# Patient Record
Sex: Male | Born: 1945
Health system: Southern US, Community
[De-identification: ages and names within clinical notes are randomized; demographics above are authoritative.]

## PROBLEM LIST (undated history)

## (undated) DIAGNOSIS — Z7982 Long term (current) use of aspirin: Secondary | ICD-10-CM

## (undated) DIAGNOSIS — R011 Cardiac murmur, unspecified: Secondary | ICD-10-CM

## (undated) DIAGNOSIS — I5189 Other ill-defined heart diseases: Secondary | ICD-10-CM

## (undated) DIAGNOSIS — Z951 Presence of aortocoronary bypass graft: Secondary | ICD-10-CM

## (undated) DIAGNOSIS — I350 Nonrheumatic aortic (valve) stenosis: Secondary | ICD-10-CM

## (undated) DIAGNOSIS — E785 Hyperlipidemia, unspecified: Secondary | ICD-10-CM

## (undated) DIAGNOSIS — R7303 Prediabetes: Secondary | ICD-10-CM

## (undated) DIAGNOSIS — M1711 Unilateral primary osteoarthritis, right knee: Secondary | ICD-10-CM

## (undated) DIAGNOSIS — Z955 Presence of coronary angioplasty implant and graft: Secondary | ICD-10-CM

## (undated) DIAGNOSIS — Z8619 Personal history of other infectious and parasitic diseases: Secondary | ICD-10-CM

## (undated) DIAGNOSIS — K219 Gastro-esophageal reflux disease without esophagitis: Secondary | ICD-10-CM

## (undated) DIAGNOSIS — M51379 Other intervertebral disc degeneration, lumbosacral region without mention of lumbar back pain or lower extremity pain: Secondary | ICD-10-CM

## (undated) DIAGNOSIS — I2510 Atherosclerotic heart disease of native coronary artery without angina pectoris: Secondary | ICD-10-CM

## (undated) DIAGNOSIS — I10 Essential (primary) hypertension: Secondary | ICD-10-CM

## (undated) HISTORY — DX: Hyperlipidemia, unspecified: E78.5

## (undated) HISTORY — PX: OTHER SURGICAL HISTORY: SHX169

## (undated) HISTORY — DX: Atherosclerotic heart disease of native coronary artery without angina pectoris: I25.10

## (undated) HISTORY — DX: Essential (primary) hypertension: I10

## (undated) HISTORY — PX: TONSILLECTOMY: SUR1361

## (undated) HISTORY — PX: CORONARY ANGIOPLASTY: SHX604

## (undated) HISTORY — DX: Personal history of other infectious and parasitic diseases: Z86.19

---

## 1999-05-27 DIAGNOSIS — I2510 Atherosclerotic heart disease of native coronary artery without angina pectoris: Secondary | ICD-10-CM

## 1999-05-27 HISTORY — PX: PERCUTANEOUS CORONARY STENT INTERVENTION (PCI-S): SHX6016

## 1999-05-27 HISTORY — DX: Atherosclerotic heart disease of native coronary artery without angina pectoris: I25.10

## 2000-02-18 ENCOUNTER — Encounter: Payer: Self-pay | Admitting: Cardiovascular Disease

## 2000-02-19 HISTORY — PX: CORONARY ANGIOPLASTY WITH STENT PLACEMENT: SHX49

## 2000-02-24 ENCOUNTER — Encounter: Payer: Self-pay | Admitting: Cardiovascular Disease

## 2005-05-26 DIAGNOSIS — I2510 Atherosclerotic heart disease of native coronary artery without angina pectoris: Secondary | ICD-10-CM

## 2005-05-26 HISTORY — PX: CORONARY ARTERY BYPASS GRAFT: SHX141

## 2005-05-26 HISTORY — DX: Atherosclerotic heart disease of native coronary artery without angina pectoris: I25.10

## 2005-07-22 ENCOUNTER — Ambulatory Visit: Payer: Self-pay | Admitting: Cardiology

## 2005-07-30 ENCOUNTER — Inpatient Hospital Stay (HOSPITAL_COMMUNITY)
Admission: RE | Admit: 2005-07-30 | Discharge: 2005-08-03 | Payer: Self-pay | Admitting: Thoracic Surgery (Cardiothoracic Vascular Surgery)

## 2005-07-30 DIAGNOSIS — Z951 Presence of aortocoronary bypass graft: Secondary | ICD-10-CM

## 2005-07-30 HISTORY — DX: Presence of aortocoronary bypass graft: Z95.1

## 2005-07-30 HISTORY — PX: CORONARY ARTERY BYPASS GRAFT: SHX141

## 2005-09-01 ENCOUNTER — Encounter
Admission: RE | Admit: 2005-09-01 | Discharge: 2005-09-01 | Payer: Self-pay | Admitting: Thoracic Surgery (Cardiothoracic Vascular Surgery)

## 2009-03-20 ENCOUNTER — Encounter: Payer: Self-pay | Admitting: Cardiovascular Disease

## 2009-03-20 LAB — CONVERTED CEMR LAB
HDL: 27.6 mg/dL
Triglyceride fasting, serum: 199 mg/dL

## 2009-07-03 ENCOUNTER — Ambulatory Visit: Payer: Self-pay | Admitting: Cardiovascular Disease

## 2009-07-03 DIAGNOSIS — E785 Hyperlipidemia, unspecified: Secondary | ICD-10-CM

## 2009-07-03 DIAGNOSIS — I10 Essential (primary) hypertension: Secondary | ICD-10-CM

## 2009-07-03 DIAGNOSIS — I25708 Atherosclerosis of coronary artery bypass graft(s), unspecified, with other forms of angina pectoris: Secondary | ICD-10-CM | POA: Insufficient documentation

## 2009-07-04 ENCOUNTER — Encounter: Payer: Self-pay | Admitting: Cardiovascular Disease

## 2009-07-12 ENCOUNTER — Encounter: Payer: Self-pay | Admitting: Cardiovascular Disease

## 2009-07-17 ENCOUNTER — Encounter: Payer: Self-pay | Admitting: Cardiovascular Disease

## 2009-10-15 ENCOUNTER — Telehealth: Payer: Self-pay | Admitting: Cardiovascular Disease

## 2010-02-11 ENCOUNTER — Telehealth: Payer: Self-pay | Admitting: Cardiovascular Disease

## 2010-06-14 ENCOUNTER — Telehealth: Payer: Self-pay | Admitting: Cardiovascular Disease

## 2010-06-25 NOTE — Cardiovascular Report (Signed)
Summary: Cardiac Cath Other  Cardiac Cath Other   Imported By: Harlon Flor 07/18/2009 10:44:58  _____________________________________________________________________  External Attachment:    Type:   Image     Comment:   External Document

## 2010-06-25 NOTE — Assessment & Plan Note (Signed)
Summary: NEW PT   Visit Type:  new patient Primary Provider:  Dr Sherrie Mustache  CC:  No complaints.  History of Present Illness: Leonard Hughes is a very pleasant 65 year old gentleman with past medical history coronary artery disease, severe proximal LAD disease in 2001 with stent placed at that time, stress test showing ischemia in 2007 leading to catheterization and eventual bypass surgery by Dr. Dorris Fetch in March of 2007  for 75% left main disease, history of obesity, and strong family history of coronary artery disease who presents to establish care.  Leonard Hughes states that currently he feels well. He has never had chest pain prior to his stress test though his stress tests have come back positive both in 2001 in 2007. 2001-lead to a stent in 2000 7t his bypass surgery. He is otherwise active him a denies any significant shortness of breath. He has had difficulty tolerating statins and manages to take lovastatin 40 mg. He has tried Crestor and Lipitor with muscle and ankle discomfort. Recently he has lost approximately 25 pounds through diet and exercise.  Preventive Screening-Counseling & Management  Alcohol-Tobacco     Alcohol type: beer     Smoking Status: quit > 6 months     Year Quit: 1981     Pack years: 0  Caffeine-Diet-Exercise     Caffeine use/day: 1-2 cups of coffee a day      Does Patient Exercise: yes     Type of exercise: walking  Current Problems (verified): 1)  Cad, Unspecified Site  (ICD-414.00) 2)  Hypertension, Benign  (ICD-401.1) 3)  Hyperlipidemia-mixed  (ICD-272.4)  Current Medications (verified): 1)  Metoprolol Succinate 50 Mg Xr24h-Tab (Metoprolol Succinate) .... Take One Tablet By Mouth Daily 2)  Lovastatin 40 Mg Tabs (Lovastatin) .... Take One Tablet By Mouth Daily At Bedtime 3)  Aspirin 81 Mg Tbec (Aspirin) .... Take One Tablet By Mouth Daily 4)  Fish Oil 1000 Mg Caps (Omega-3 Fatty Acids) .... 2 Tab By Mouth Once Daily 5)  Daily Multi  Tabs (Multiple  Vitamins-Minerals) .Marland Kitchen.. 1 Tab By Mouth Once Daily 6)  Coq10 100 Mg Caps (Coenzyme Q10) .Marland Kitchen.. 1 Tab By Mouth Once Daily 7)  Insta-Flex For Joint Support .Marland Kitchen.. 1 Tab By Mouth Once Daily  Allergies (verified): No Known Drug Allergies  Past History:  Past Medical History: Last updated: 23-Jul-2009 CAD Hyperlipidemia Hypertension  Family History: Last updated: 2009-07-23 Father: deceased 69 of MI Mother: decesaed 67: ICD and stents Brother: deceased MI age 84 Sister: living triple bypass surgery  Social History: Last updated: 23-Jul-2009 Full Time Married  Tobacco Use - Former.  Alcohol Use - yes Regular Exercise - no  Risk Factors: Caffeine Use: 1-2 cups of coffee a day  (07/03/2009) Exercise: yes (07/03/2009)  Risk Factors: Smoking Status: quit > 6 months (07/03/2009)  Past Surgical History: Open heart surgery 2007  Social History: Smoking Status:  quit > 6 months Pack years:  0 Caffeine use/day:  1-2 cups of coffee a day  Does Patient Exercise:  yes  Review of Systems  The patient denies anorexia, fever, weight loss, weight gain, vision loss, decreased hearing, hoarseness, chest pain, syncope, dyspnea on exertion, peripheral edema, prolonged cough, headaches, hemoptysis, abdominal pain, melena, hematochezia, severe indigestion/heartburn, hematuria, incontinence, genital sores, muscle weakness, suspicious skin lesions, transient blindness, difficulty walking, depression, unusual weight change, abnormal bleeding, enlarged lymph nodes, angioedema, breast masses, and testicular masses.    Vital Signs:  Patient profile:   65 year old male Height:  71 inches Weight:      217.75 pounds BMI:     30.48 Pulse rate:   60 / minute Pulse rhythm:   regular BP sitting:   132 / 80  (left arm) Cuff size:   regular  Vitals Entered By: Mercer Pod (July 03, 2009 9:34 AM)  Physical Exam  General:  HEENT exam is benign, oropharynx is clear, neck is supple with no  JVP or carotid bruits, heart sounds are regular with S1-S2 no murmurs appreciated, lungs are clear to auscultation with no wheezes or rales, abdominal exam is benign, no significant lower extremity edema, neurological exam is grossly nonfocal, skin is warm and dry, pulses are equal and symmetrical in her upper and lower extremities   New Orders:     1)  Nuclear Stress Test (Nuc Stress Test)   EKG  Procedure date:  07/03/2009  Findings:      EKG shows normal sinus rhythm with a rate of 60 beats per minute, no significant ST or T wave changes. No prior study available for comparison.  Impression & Recommendations:  Problem # 1:  CAD, ARTERY BYPASS GRAFT (ICD-414.04) patient has a RIMA to the LAD and LIMA to marginal branch in 2007 by Dr. Dorris Fetch, currently symptom free. His ischemia has been identified by routine stress testing on a periodic basis. It has been several years since his bypass surgery and given that he typically does not have angina symptoms, we have set him up for a stress test. We'll send the results to him as soon as they are available. His updated medication list for this problem includes:    Metoprolol Succinate 50 Mg Xr24h-tab (Metoprolol succinate) .Marland Kitchen... Take one tablet by mouth daily    Aspirin 81 Mg Tbec (Aspirin) .Marland Kitchen... Take one tablet by mouth daily  Orders: Nuclear Stress Test (Nuc Stress Test)  Problem # 2:  HYPERLIPIDEMIA-MIXED (ICD-272.4) I am concerned that his cholesterol would not be at goal as he is had difficulty tolerating statins. We'll try to obtain his most recent lipid panel for our records. I am very encouraged that he has lost 25 pounds over the past year. We may need to add zetia to his medication regiment if his LDL is not less than 70. We will try to obtain his carotid ultrasound from Dr. Sunday Corn office for our review. His updated medication list for this problem includes:    Lovastatin 40 Mg Tabs (Lovastatin) .Marland Kitchen... Take one tablet by  mouth daily at bedtime  Problem # 3:  HYPERTENSION, BENIGN (ICD-401.1) blood pressure is well controlled on his current medication regiment. We did suggest that he increase his aspirin to 2 baby aspirin per day. His updated medication list for this problem includes:    Metoprolol Succinate 50 Mg Xr24h-tab (Metoprolol succinate) .Marland Kitchen... Take one tablet by mouth daily    Aspirin 81 Mg Tbec (Aspirin) .Marland Kitchen... Take one tablet by mouth daily  Patient Instructions: 1)  Your physician recommends that you schedule a follow-up appointment.  We will call you with the appt date and time. 2)  Your physician recommends that you continue on your current medications as directed. Please refer to the Current Medication list given to you today. 3)  Your physician has requested that you have an exercise stress myoview.  For further information please visit [email or web address]  Please follow instruction sheet, as given.  Appended Document: NEW PT Addendum to PE:  Pulses are 2+ b/l in his uper and lower extremities.

## 2010-06-25 NOTE — Letter (Signed)
Summary: Medical Record Release  Medical Record Release   Imported By: Harlon Flor 07/12/2009 15:13:11  _____________________________________________________________________  External Attachment:    Type:   Image     Comment:   External Document

## 2010-06-25 NOTE — Progress Notes (Signed)
Summary: RX lovastatin & metoprolol  Phone Note Refill Request Call back at Home Phone [phone] Message from:  wife on February 11, 2010 11:57 AM  Refills Requested: Medication #1:  LOVASTATIN 40 MG TABS Take one tablet by mouth daily at bedtime  Medication #2:  METOPROLOL SUCCINATE 50 MG XR24H-TAB Take one tablet by mouth daily [facility] [address]  Initial call taken by: Harlon Flor,  February 11, 2010 11:57 AM  Follow-up for Phone Call        Rx faxed to pharmacy Follow-up by: Bishop Dublin, CMA,  February 11, 2010 11:59 AM    Prescriptions: LOVASTATIN 40 MG TABS (LOVASTATIN) Take one tablet by mouth daily at bedtime  #30 x 6   Entered by:   Bishop Dublin, CMA   Authorized by:   Dossie Arbour MD   Signed by:   Bishop Dublin, CMA on 02/11/2010   Method used:   Electronically to        [facility] [address] [phone]* (retail)       [address] [place], [place]  [address]       Ph: [phone]       Fax: [phone]   RxID:   [id number] METOPROLOL SUCCINATE 50 MG XR24H-TAB (METOPROLOL SUCCINATE) Take one tablet by mouth daily  #30 x 6   Entered by:   Bishop Dublin, CMA   Authorized by:   Dossie Arbour MD   Signed by:   Bishop Dublin, CMA on 02/11/2010   Method used:   Electronically to        [facility] [address] [phone]* (retail)       [address] [place]  [address]       Ph: [phone]       Fax: [phone]   RxID:   [id number]

## 2010-06-25 NOTE — Progress Notes (Signed)
Summary: pt follow-up  Phone Note Call from Patient   Caller: Patient Call For: Gollan Summary of Call: Pt saw Dr Mariah Milling as a new pt 2/11.  Dr Mariah Milling ordered a stress test, but insurance would not cover.  Pt unsure of what Dr Mariah Milling wanted to do instead, or for follow-up.  Please advise.  Thanks Initial call taken by: Cloyde Reams RN,  Oct 15, 2009 9:31 AM  Follow-up for Phone Call        If he has no new sx, we will have to wait 5 years after CABG. New sx, we can do stress test anytime. We need his latest cholesterol from dr. Delia Chimes.  Goal ldl <70, chol <150.     Appended Document: pt follow-up Attempted to call pt.  LMOM TCB.  EWJ  Appended Document: pt follow-up Attempted to call pt again, LMOM TCB.  Called Dr Hedrick's office pt's last cholesterol labs done in 02/2009 done by Dr Darrold Junker.  Called and requested results again.  EWJ

## 2010-06-25 NOTE — Progress Notes (Signed)
Summary: PHI  PHI   Imported By: Harlon Flor 07/10/2009 10:17:09  _____________________________________________________________________  External Attachment:    Type:   Image     Comment:   External Document

## 2010-06-27 NOTE — Progress Notes (Signed)
Summary: RX  Phone Note Refill Request Call back at Home Phone [phone] Call back at [phone] Message from:  Wife on June 14, 2010 11:44 AM  Refills Requested: Medication #1:  METOPROLOL SUCCINATE 50 MG XR24H-TAB Take one tablet by mouth daily Would like an RX to be sent to [facility] on [address].  Mail order will not get here for 7-10 business days.  Initial call taken by: Harlon Flor,  June 14, 2010 11:45 AM    Prescriptions: METOPROLOL SUCCINATE 50 MG XR24H-TAB (METOPROLOL SUCCINATE) Take one tablet by mouth daily  #30 x 0   Entered by:   Bishop Dublin, CMA   Authorized by:   Dossie Arbour MD   Signed by:   Bishop Dublin, CMA on 06/14/2010   Method used:   Electronically to        [facility]. [address]* (retail)       [address]       [place], [place]  [address]       Ph: [phone]       Fax: [phone]   RxID:   [id number]

## 2010-09-09 ENCOUNTER — Other Ambulatory Visit: Payer: Self-pay | Admitting: Emergency Medicine

## 2010-09-09 MED ORDER — LOVASTATIN 40 MG PO TABS: 40.0000 mg | ORAL_TABLET | Freq: Every day | ORAL | Status: DC

## 2010-10-09 ENCOUNTER — Encounter: Payer: Self-pay | Admitting: Cardiovascular Disease

## 2010-10-09 ENCOUNTER — Ambulatory Visit (INDEPENDENT_AMBULATORY_CARE_PROVIDER_SITE_OTHER): Payer: 59 | Admitting: Cardiovascular Disease

## 2010-10-09 DIAGNOSIS — E785 Hyperlipidemia, unspecified: Secondary | ICD-10-CM

## 2010-10-09 DIAGNOSIS — R5383 Other fatigue: Secondary | ICD-10-CM | POA: Insufficient documentation

## 2010-10-09 DIAGNOSIS — I10 Essential (primary) hypertension: Secondary | ICD-10-CM

## 2010-10-09 DIAGNOSIS — I2510 Atherosclerotic heart disease of native coronary artery without angina pectoris: Secondary | ICD-10-CM

## 2010-10-09 DIAGNOSIS — I25810 Atherosclerosis of coronary artery bypass graft(s) without angina pectoris: Secondary | ICD-10-CM

## 2010-10-09 NOTE — Assessment & Plan Note (Signed)
We will check a testosterone level. His symptoms of fatigue are somewhat nonspecific. He reports having good sleep though he does work hard and does long days.

## 2010-10-09 NOTE — Assessment & Plan Note (Signed)
Previous cholesterol was elevated. We might need to add zetia 10 mg daily to his lovastatin to reach goal LDL less than 70.

## 2010-10-09 NOTE — Patient Instructions (Addendum)
You are doing well. No medication changes were made. We will send Rx for Plavix 75mg  take once daily. Please call us if you have new issues that need to be addressed before your next appt.  We will call you for a follow up Appt. In 6 months Your physician recommends that you return for lab work in: Tomorrow AM (lipid/lft/BMP/Free&Total Testosterone)

## 2010-10-09 NOTE — Assessment & Plan Note (Signed)
Blood pressure is well controlled on today's visit. No changes made to the medications. 

## 2010-10-09 NOTE — Assessment & Plan Note (Signed)
We'll check his cholesterol, LFTs. We'll also start him on Plavix with his low-dose aspirin now that it is generic,

## 2010-10-09 NOTE — Progress Notes (Signed)
   Patient ID: Leonard Hughes, male    DOB: 14-Feb-1946, 65 y.o.   MRN: [id number]  HPI Comments: Leonard Hughes is a very pleasant 65 year old gentleman with past medical history coronary artery disease, severe proximal LAD disease in 2001 with stent placed at that time, stress test showing ischemia in 2007 leading to catheterization and eventual bypass surgery by Dr. Dorris Fetch in March of 2007  for 75% left main disease, history of obesity, and strong family history of coronary artery disease who presents for follow up.   Leonard Hughes states that currently he feels well. He has never had chest pain prior to his stress test though his stress tests have come back positive both in 2001 in 2007. 2001-lead to a stent in 2007, his bypass surgery. He is otherwise active him a denies any significant shortness of breath.  He walks 2 miles a day in the morning. He does have some fatigue though this is somewhat nonspecific. He used to walk 4 miles a day. He does have toe cramping with lovastatin as been able to tolerate the medication. He has tried Crestor and Lipitor with muscle and ankle discomfort. Recently he has lost approximately 25 pounds through diet and exercise.   EKG shows normal sinus rhythm with rate 60 beats per minute with no significant ST or T wave changes     Review of Systems  Constitutional: Positive for fatigue.  HENT: Negative.   Eyes: Negative.   Respiratory: Negative.   Cardiovascular: Negative.   Gastrointestinal: Negative.   Musculoskeletal: Negative.   Skin: Negative.   Neurological: Negative.   Hematological: Negative.   Psychiatric/Behavioral: Negative.   All other systems reviewed and are negative.   BP 130/80  Pulse 60  Ht 5\' 11"  (1.803 m)  Wt 220 lb (99.791 kg)  BMI 30.68 kg/m2    Physical Exam  Nursing note and vitals reviewed. Constitutional: He is oriented to person, place, and time. He appears well-developed and well-nourished.  HENT:  Head: Normocephalic.    Nose: Nose normal.  Mouth/Throat: Oropharynx is clear and moist.  Eyes: Conjunctivae are normal. Pupils are equal, round, and reactive to light.  Neck: Normal range of motion. Neck supple. No JVD present.  Cardiovascular: Normal rate, regular rhythm, S1 normal, S2 normal, normal heart sounds and intact distal pulses.  Exam reveals no gallop and no friction rub.   No murmur heard. Pulmonary/Chest: Effort normal and breath sounds normal. No respiratory distress. He has no wheezes. He has no rales. He exhibits no tenderness.  Abdominal: Soft. Bowel sounds are normal. He exhibits no distension. There is no tenderness.  Musculoskeletal: Normal range of motion. He exhibits no edema and no tenderness.  Lymphadenopathy:    He has no cervical adenopathy.  Neurological: He is alert and oriented to person, place, and time. Coordination normal.  Skin: Skin is warm and dry. No rash noted. No erythema.  Psychiatric: He has a normal mood and affect. His behavior is normal. Judgment and thought content normal.           Assessment and Plan

## 2010-10-10 ENCOUNTER — Other Ambulatory Visit (INDEPENDENT_AMBULATORY_CARE_PROVIDER_SITE_OTHER): Payer: 59 | Admitting: *Deleted

## 2010-10-10 DIAGNOSIS — E785 Hyperlipidemia, unspecified: Secondary | ICD-10-CM

## 2010-10-10 DIAGNOSIS — I2510 Atherosclerotic heart disease of native coronary artery without angina pectoris: Secondary | ICD-10-CM

## 2010-10-10 DIAGNOSIS — R5383 Other fatigue: Secondary | ICD-10-CM

## 2010-10-11 LAB — HEPATIC FUNCTION PANEL
Albumin: 4.4 g/dL (ref 3.5–5.2)
Bilirubin, Direct: 0.1 mg/dL (ref 0.0–0.3)
Indirect Bilirubin: 0.4 mg/dL (ref 0.0–0.9)
Total Bilirubin: 0.5 mg/dL (ref 0.3–1.2)

## 2010-10-11 LAB — BASIC METABOLIC PANEL
CO2: 26 mEq/L (ref 19–32)
Chloride: 104 mEq/L (ref 96–112)
Creat: 0.96 mg/dL (ref 0.40–1.50)
Glucose, Bld: 99 mg/dL (ref 70–99)

## 2010-10-11 LAB — LIPID PANEL
HDL: 34 mg/dL — ABNORMAL LOW (ref >39)
Total CHOL/HDL Ratio: 5 Ratio
VLDL: 48 mg/dL — ABNORMAL HIGH (ref 0–40)

## 2010-10-11 LAB — TESTOSTERONE, FREE, TOTAL, SHBG: Testosterone, Free: 73.7 pg/mL (ref 47.0–244.0)

## 2010-10-11 NOTE — Op Note (Signed)
NAMEMARCIN, Leonard Hughes               ACCOUNT NO.:  [id number]   MEDICAL RECORD NO.:  [id number]          PATIENT TYPE:  INP   LOCATION:  2306                         FACILITY:  [facility]   PHYSICIAN:  Salvatore Decent. Dorris Fetch, M.D.DATE OF BIRTH:  03-Oct-1945   DATE OF PROCEDURE:  07/30/2005  DATE OF DISCHARGE:                                 OPERATIVE REPORT   PREOPERATIVE DIAGNOSIS:  Left main disease.   POSTOPERATIVE DIAGNOSIS:  Left main disease   PROCEDURE:  Median sternotomy, extracorporeal circulation, coronary artery  bypass grafting x2 (right internal mammary artery to LAD, left internal  mammary artery to OM1).   SURGEON:  Charlett Lango.   ASSISTANT:  Jerold Coombe, P.A.   ANESTHESIA:  General.   FINDINGS:  Good-quality targets, good-quality conduits.   CLINICAL NOTE:  Leonard Hughes is a 65 year old gentleman with a history of  coronary disease and a strong family history of coronary disease.  On a  scheduled stress test, he had ECG changes. Dr. Edmonia Lynch performed  cardiac catheterization where he was found to have a 75% left main stenosis.  He had some mild disease in the right coronary but nothing exceeding 40% and  no hemodynamically significant stenoses in the right coronary system.  Due  to his left main disease, the patient was referred for coronary artery  bypass grafting.  The indications, risks, benefits and alternatives were  discussed in detail with the patient. He understood and accepted the risks  and agreed to proceed.   OPERATIVE NOTE:  Leonard Hughes was brought to the preop holding area on  07/30/2005.  There the anesthesia service placed lines to monitor arterial,  central venous and pulmonary arterial pressure. ECG leads were placed for  continuous telemetry.  Intravenous antibiotics were administered. The  patient was taken to the operating room, anesthetized and intubated.  A  Foley catheter was placed. The chest, abdomen and legs were prepped  and  draped in usual fashion.   A median sternotomy was performed. The left internal mammary artery was  harvested using standard technique. 5000 units of heparin was administered  during takedown of the vessel. The mammary artery was good quality vessel.  There was excellent flow through the graft when the distal end was divided.  The mammary was placed in a papaverine soaked sponge and placed in the left  pleural space.   Next the right internal mammary artery was harvested in similar fashion.  It  was also a good-quality large caliber vessel. The remainder of the full  heparin dose was given prior to dividing the distal end of the right mammary  artery. It likewise was left in a papaverine soaked sponge in the right  pleural space.   The pericardium was opened. The ascending aorta was inspected and palpated.  The aorta was cannulated via concentric 2-0 Ethilon nonpledgeted pursestring  sutures. A dual stage venous cannula was placed via pursestring suture in  the right atrial appendage. Cardiopulmonary bypass was instituted. The  patient was cooled to 32 degrees Celsius.  The coronary arteries were  inspected and anastomotic sites were  chosen. The right mammary artery was  inspected to ensure that it would easily reach the left anterior descending  without tension. The decision was made to use the right mammary as graft to  the LAD. The left mammary also was ensured to reach the obtuse marginal  without tension as well. The cardioplegia cannula was placed in the  ascending aorta.  The aorta was crossclamped, the left ventricle was emptied  via the aortic root vent. Cardiac arrest was achieved with a combination of  cold antegrade blood cardioplegia and topical iced saline. 1 liter of  cardioplegia was administered. Myocardial septal temperature was 10 degrees  Celsius. The following distal anastomoses were performed.   First the left internal mammary artery was brought through a  window in the  pericardium.  The distal end was beveled and anastomosed end-to-side to  obtuse marginal 1. The left mammary was a 2.5-mm good-quality conduit. The  OM1 was a 2-mm good-quality target. The anastomosis was performed with a  running 8-0 Prolene suture.  At the completion of the anastomosis, it was  probed proximally and distally prior to tying the suture. The bulldog clamp  then was removed to inspect for hemostasis. The mammary pedicle was tacked  epicardial surface of the heart with 6-0 Prolene sutures.   Additional cardioplegia was administered. The right internal mammary artery  then was brought to a window in the pericardium on the right side.  The  distal end was beveled.  It was then anastomosed to the mid-LAD relatively  high on the heart. The LAD was a 2-mm good-quality target. The right mammary  was 2.5 mm good-quality conduit.  The anastomosis was performed with a  running 8-0 Prolene suture. At the completion of the right mammary to LAD  anastomosis, the bulldog clamp was removed. Immediate and rapid septal  rewarming was noted. There was good hemostasis at the anastomosis. Lidocaine  was administered. The mammary pedicle was tacked to the epicardial surface  the heart with 6-0 Prolene sutures. The patient was placed in Trendelenburg  position. The aortic crossclamp was removed. Total crossclamp time was 47  minutes.   Of the anastomoses were inspected for hemostasis. Epicardial pacing wires  were placed on the right ventricle and right atrium.  The patient's  temperature been allowed to gently drift to 34 degrees Celsius during the  crossclamp time.  The patient was rewarmed and when the core temperature  reached 37 degrees Celsius, he was weaned from cardiopulmonary bypass  without difficulty. Total bypass time was 74 minutes. Initial cardiac index  was greater than 2 liters per minute. Per meter squared. The patient remained hemodynamically stable throughout  post bypass period.   A test dose of protamine was administered as well tolerated.  The atrial and  aortic cannulae were removed. The remaining protamine was administered  without incident. The chest was irrigated with 1 liter of warm normal saline  containing 1 gram of vancomycin. Hemostasis was achieved.  Bilateral pleural  and a single mediastinal chest tubes were placed through separate subcostal  incisions. The pericardium was reapproximated with 3-0 silk sutures.  It  came together easily without tension, without kinking the underlying grafts.  The sternum was closed with interrupted heavy gauge stainless steel wires.  The remaining incisions closed in standard fashion. A subcuticular closure  was used for the skin.  On-Q catheters were placed on both sides of the  sternum.  5 mL of 40% Marcaine  was infiltrated through the  catheters.  They were then hooked to the On-Q  delivery system. All sponge, needle and instrument counts were correct at  the end of the procedure. The patient was taken from the operating room to  the surgical intensive care unit in critical but stable condition.           ______________________________  Salvatore Decent Dorris Fetch, M.D.     SCH/MEDQ  D:  07/30/2005  T:  07/31/2005  Job:  [id number]   cc:   Dr. Edmonia Lynch   Dr. Hassan Rowan, [place]

## 2010-10-11 NOTE — Discharge Summary (Signed)
NAMEWESLEE, FOGG               ACCOUNT NO.:  [id number]   MEDICAL RECORD NO.:  [id number]          PATIENT TYPE:  INP   LOCATION:  2034                         FACILITY:  [facility]   PHYSICIAN:  Salvatore Decent. Dorris Fetch, M.D.DATE OF BIRTH:  June 08, 1945   DATE OF ADMISSION:  07/30/2005  DATE OF DISCHARGE:  08/03/2005                                 DISCHARGE SUMMARY   ADMISSION DIAGNOSIS:  Coronary artery disease with left main disease.   SECONDARY DIAGNOSES:  1.  Coronary artery disease with left main disease, status post coronary      artery bypass graft times two.  2.  Hypertension.  3.  Hyperlipidemia.  4.  History of coronary stent to the left circumflex in September 2001.  5.  No known drug allergies.   PROCEDURES:  Median sternotomy for coronary artery bypass grafting times two  using the right internal mammary artery to the left anterior descending, and  left internal mammary artery to the first obtuse marginal branch. Surgeon  was Dr. Viviann Spare C. Hendrickson.   BRIEF HISTORY:  [name] is a 65 year old Caucasian male with history of  hypertension, hypercholesterolemia, coronary artery disease, status post  stent to his mid circumflex in 2001. At that time he apparently had not been  having any chest pain or shortness of breath, even though he had a positive  stress test. His initial workup in 2001 was done because of his very strong  history of coronary artery disease. He has been doing well since then and  has been able to play golf and exercises six days per week. 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 He has not been experiencing any chest pain or shortness of  breath. Over the past year or two, however, he has noticed that he has had a  decrease in his energy which has not been traumatic or sudden. He also had a  couple bouts of indigestion which had always been related to food and  relieved with Prilosec. He saw Dr.  Darrold Junker for routine follow-up visit and  had an exercise tolerance test which showed no evidence of abnormality of  the Cardiolite and he had normal function, but did have some indeterminate  EKG changes. Because of his history of silent ischemia it was decided with  cardiac catheterization and he was found to have 75% distal main stenosis.  The stent to his circumflex was patent. He had approximately 30% to 40%  narrowing in the right coronary artery, but no hemodynamically significant  stenosis. His left ventricular function was normal. He was referred to Dr.  Charlett Lango for possible surgical coronary revascularization. He was  seen in the office on July 25, 2005, felt due to his left main stenosis  would benefit from coronary artery bypass grafting surgery. After discussing  the risks, benefits, and alternatives, Mr. Shenberger agreed to proceed.   HOSPITAL COURSE:  Mr. Henshaw was electively admitted to [facility]  on July 30, 2005, and underwent coronary artery bypass grafting times two.  There were no known  intraoperative complications and he was transferred to  the surgical intensive care unit hemodynamically stable. On postoperative  day one he had been extubated, neurologically intact. At the time of his  dictation his postoperative course had been quite stable. Chest tubes and  mediastinal tubes were removed on postoperative day one. He did require Neo-  Synephrine initially, but was weaned on postoperative day one and then later  transferred to telemetry unit 2000 where it was anticipated he would remain  until discharged. He has remained hemodynamically stable with blood pressure  ranging between 125/60, his heart rate has been in sinus rhythm around 90,  he has been afebrile and saturating above 90% on room air.  His chest x-ray  has remained stable, showed no pneumothorax, with small effusion and  bibasilar atelectasis. He did receive a few doses of Lasix for  mild  postoperative volume excess. His urine output has been adequate and renal  function has remained stable with creatinine of 0.9.  He has been able to  ambulate independently. His heart had a regular rate and rhythm. His lung  sounds were clear. His abdominal exam was benign. Extremities showed no  edema. His sternal incision was healing well without signs of infection. It  was anticipated that if he continues to make to good progress that he might  be ready for discharge as early as postoperative day three. Otherwise, will  anticipate him going home on postoperative day four, March 11th.  Postoperative labs show a sodium of 140, potassium 4.0, chloride 107, CO2  27, BUN 9, creatinine 0.9, blood glucose 130.  White blood cell count 8.3,  hemoglobin 10.3, hematocrit 29.5, platelet count 124,000. He had a  hemoglobin A1c preoperatively that was 5.8.  Liver function tests show AST  of 24, ALT 29, alkaline phosphatase 48, total bilirubin 0.7, and blood  albumin of 4.3.   DISCHARGE MEDICATIONS:  1.  Coated aspirin 325 mg p.o. daily.  2.  Toprol XL 25 mg daily.  3.  Crestor 20 mg p.o. daily.  4.  Hytrin 5 mg p.o. daily.  5.  Tylox one or two tablets p.o. q.4-6h p.r.n. pain.   DISCHARGE INSTRUCTIONS:  He is instructed to avoid driving or heavy lifting  more than 10 pounds. He is encouraged to continue daily walking and  breathing exercises. He is to follow a low fat and low salt diet. He may  shower and clean his incisions gently with mild soap and water. He should  notify the [facility] office he develops fever greater than 101 or redness or  drainage from his incision site.   FOLLOWUP:  He is to follow up with Dr. Viviann Spare C. Hendrickson at the [facility]  office on September 01, 2005, at 12:15 p.m. with a chest x-ray one hour before  this appointment at the [facility]. He is to call and schedule a two-week follow-up as well with Dr. Marcina Millard.      Jerold Coombe,  P.A.    ______________________________  Salvatore Decent Dorris Fetch, M.D.    AWZ/MEDQ  D:  08/01/2005  T:  08/03/2005  Job:  [id number]   cc:   Hassan Rowan, M.D.   Marcina Millard, M.D.

## 2010-10-13 ENCOUNTER — Encounter: Payer: Self-pay | Admitting: Cardiovascular Disease

## 2010-10-15 ENCOUNTER — Other Ambulatory Visit: Payer: Self-pay | Admitting: Cardiovascular Disease

## 2010-10-15 ENCOUNTER — Telehealth: Payer: Self-pay | Admitting: *Deleted

## 2010-10-15 MED ORDER — EZETIMIBE 10 MG PO TABS: 10.0000 mg | ORAL_TABLET | Freq: Every day | ORAL | Status: DC

## 2010-10-15 MED ORDER — CLOPIDOGREL BISULFATE 75 MG PO TABS: 75.0000 mg | ORAL_TABLET | Freq: Every day | ORAL | Status: DC

## 2010-10-15 NOTE — Telephone Encounter (Signed)
Notified pt of results below, left samples of Zetia at front for pt and called in zetia to [facility].

## 2010-10-15 NOTE — Telephone Encounter (Signed)
Attempted to contact pt, LMOM TCB. When pt calls back, will notify him of his lab results per Dr. Windell Hummingbird letter: Cholesterol is above goal on your current lipid regimen.  Work on [employer] and weight loss if possible. Goal LDL is less than 70. Goal total cholesterol is less than 150. We will probably need to add a medication to your lovastatin. This medication is called zetia and is taken daily. It should drop your numbers an additional 10%. Please call for some samples and prescription. The testosterone numbers look to be in a normal range.

## 2011-04-07 ENCOUNTER — Encounter: Payer: Self-pay | Admitting: Cardiovascular Disease

## 2011-04-08 ENCOUNTER — Encounter: Payer: Self-pay | Admitting: Cardiovascular Disease

## 2011-04-08 ENCOUNTER — Ambulatory Visit (INDEPENDENT_AMBULATORY_CARE_PROVIDER_SITE_OTHER): Payer: 59 | Admitting: Cardiovascular Disease

## 2011-04-08 VITALS — BP 138/78 | HR 55 | Resp 16 | Ht 71.0 in | Wt 220.0 lb

## 2011-04-08 DIAGNOSIS — E785 Hyperlipidemia, unspecified: Secondary | ICD-10-CM

## 2011-04-08 DIAGNOSIS — I10 Essential (primary) hypertension: Secondary | ICD-10-CM

## 2011-04-08 DIAGNOSIS — I25810 Atherosclerosis of coronary artery bypass graft(s) without angina pectoris: Secondary | ICD-10-CM

## 2011-04-08 DIAGNOSIS — I2510 Atherosclerotic heart disease of native coronary artery without angina pectoris: Secondary | ICD-10-CM

## 2011-04-08 MED ORDER — METOPROLOL SUCCINATE ER 50 MG PO TB24: 50.0000 mg | ORAL_TABLET | Freq: Every day | ORAL | Status: DC

## 2011-04-08 MED ORDER — ATORVASTATIN CALCIUM 40 MG PO TABS: 40.0000 mg | ORAL_TABLET | Freq: Every day | ORAL | Status: DC

## 2011-04-08 MED ORDER — CLOPIDOGREL BISULFATE 75 MG PO TABS: 75.0000 mg | ORAL_TABLET | Freq: Every day | ORAL | Status: DC

## 2011-04-08 NOTE — Assessment & Plan Note (Signed)
Currently with no symptoms of angina. No further workup at this time. Continue current medication regimen. 

## 2011-04-08 NOTE — Assessment & Plan Note (Signed)
Cholesterol is above goal. We have suggested he add zetia, but he does not want to do this as it is too expensive. We will retry Lipitor 40 mg daily.

## 2011-04-08 NOTE — Patient Instructions (Addendum)
You are doing well. Please change the lovastatin to lipitor. Start a 1/2 dose for a few weeks and then increase to a full pill  Please call us if you have new issues that need to be addressed before your next appt.  The office will contact you for a follow up Appt. In 6 months  We will check cholesterol in 6 months before your visit

## 2011-04-08 NOTE — Progress Notes (Signed)
Patient ID: Leonard Hughes, male    DOB: 1945-10-26, 65 y.o.   MRN: [id number]  HPI Comments: Leonard Hughes is a very pleasant 65 year old gentleman with past medical history coronary artery disease, severe proximal LAD disease in 2001 with stent placed at that time, stress test showing ischemia in 2007 leading to catheterization and eventual bypass surgery by Dr. Dorris Fetch in March of 2007  for 75% left main disease, history of obesity, and strong family history of coronary artery disease who presents for follow up.    Leonard Hughes states that currently he feels well. He has never had chest pain prior to his stress test though his stress tests have come back positive both in 2001 in 2007. 2001-lead to a stent in 2007, his bypass surgery. He is otherwise active him a denies any significant shortness of breath.   He walks 2 miles a day in the morning.  He does have toe cramping with lovastatin. He has tried Crestor and Lipitor with muscle and ankle discomfort. he has lost approximately 25 pounds through diet and exercise. He previously stopped zetia secondary to price.    EKG shows normal sinus rhythm with rate 55 beats per minute with no significant ST or T wave changes      Outpatient Encounter Prescriptions as of 04/08/2011  Medication Sig Dispense Refill  . aspirin 81 MG EC tablet Take 81 mg by mouth daily.        . clopidogrel (PLAVIX) 75 MG tablet Take 1 tablet (75 mg total) by mouth daily.  90 tablet  3  . Glucosamine-MSM-Hyaluronic Acd (JOINT HEALTH PO) Take 1 capsule by mouth daily.        . metoprolol (TOPROL-XL) 50 MG 24 hr tablet Take 1 tablet (50 mg total) by mouth daily.  90 tablet  4  . Multiple Vitamin (MULTIVITAMIN) tablet Take 1 tablet by mouth daily.        . Omega-3 Fatty Acids (FISH OIL) 1000 MG CAPS Take 2 capsules by mouth daily.        Marland Kitchen  lovastatin (MEVACOR) 40 MG tablet Take 1 tablet (40 mg total) by mouth at bedtime.  30 tablet  6     Review of Systems    Constitutional: Negative.   HENT: Negative.   Eyes: Negative.   Respiratory: Negative.   Cardiovascular: Negative.   Gastrointestinal: Negative.   Musculoskeletal: Positive for myalgias.  Skin: Negative.   Neurological: Negative.   Hematological: Negative.   Psychiatric/Behavioral: Negative.   All other systems reviewed and are negative.    BP 138/78  Pulse 55  Resp 16  Ht 5\' 11"  (1.803 m)  Wt 220 lb (99.791 kg)  BMI 30.68 kg/m2  Physical Exam  Nursing note and vitals reviewed. Constitutional: He is oriented to person, place, and time. He appears well-developed and well-nourished.  HENT:  Head: Normocephalic.  Nose: Nose normal.  Mouth/Throat: Oropharynx is clear and moist.  Eyes: Conjunctivae are normal. Pupils are equal, round, and reactive to light.  Neck: Normal range of motion. Neck supple. No JVD present.  Cardiovascular: Normal rate, regular rhythm, S1 normal, S2 normal, normal heart sounds and intact distal pulses.  Exam reveals no gallop and no friction rub.   No murmur heard. Pulmonary/Chest: Effort normal and breath sounds normal. No respiratory distress. He has no wheezes. He has no rales. He exhibits no tenderness.  Abdominal: Soft. Bowel sounds are normal. He exhibits no distension. There is no tenderness.  Musculoskeletal: Normal range  of motion. He exhibits no edema and no tenderness.  Lymphadenopathy:    He has no cervical adenopathy.  Neurological: He is alert and oriented to person, place, and time. Coordination normal.  Skin: Skin is warm and dry. No rash noted. No erythema.  Psychiatric: He has a normal mood and affect. His behavior is normal. Judgment and thought content normal.           Assessment and Plan

## 2011-04-08 NOTE — Assessment & Plan Note (Signed)
Blood pressure is well controlled on today's visit. No changes made to the medications. 

## 2011-04-29 ENCOUNTER — Telehealth: Payer: Self-pay | Admitting: *Deleted

## 2011-04-29 NOTE — Telephone Encounter (Signed)
Pt came by office today stating since taking Lipitor 40mg  he is having muscle cramping, and cannot take anymore. Pt was seen 11/13, he was changed from Lovastatin to Lipitor due to mild toe cramps. He has taken Crestor as well in past with worse side effects. He has not tried Simva or Pravastatin. Last lipids were 09/2010 TC 170 TG 241 HDL 34 LDL 88. H/O CAD, with bypass. He is going out of town and is asking for change in med, if you want him to restart Lovastatin we will send to [facility], otherwise can send Rx to [facility]. Please advise. Thanks.

## 2011-04-30 ENCOUNTER — Telehealth: Payer: Self-pay | Admitting: *Deleted

## 2011-04-30 NOTE — Telephone Encounter (Signed)
In addition to previous msg, per Dr. Mariah Milling we will have pt start Vytorin 10/20. Will give samples and send Rx with coupon for 30 day free supply and will be discount after that. Pt came by office today, info given above. He is going to compare prices with [facility] and [facility] and will let us know what pharmacy to send Rx.

## 2011-05-01 ENCOUNTER — Telehealth: Payer: Self-pay | Admitting: *Deleted

## 2011-05-01 MED ORDER — EZETIMIBE-SIMVASTATIN 10-20 MG PO TABS: 1.0000 | ORAL_TABLET | Freq: Every day | ORAL | Status: DC

## 2011-05-01 NOTE — Telephone Encounter (Signed)
Pt requested vytorin be sent to [facility] 30 day supply, this will be more affordable than [facility] with his discount card. Pt will call with any adverse effects after taking.

## 2011-06-18 DIAGNOSIS — Z0279 Encounter for issue of other medical certificate: Secondary | ICD-10-CM

## 2011-10-06 ENCOUNTER — Encounter: Payer: Self-pay | Admitting: Cardiovascular Disease

## 2011-10-06 ENCOUNTER — Ambulatory Visit (INDEPENDENT_AMBULATORY_CARE_PROVIDER_SITE_OTHER): Payer: 59 | Admitting: Cardiovascular Disease

## 2011-10-06 VITALS — BP 142/78 | HR 56 | Ht 71.0 in | Wt 226.0 lb

## 2011-10-06 DIAGNOSIS — R0602 Shortness of breath: Secondary | ICD-10-CM

## 2011-10-06 DIAGNOSIS — I25810 Atherosclerosis of coronary artery bypass graft(s) without angina pectoris: Secondary | ICD-10-CM

## 2011-10-06 DIAGNOSIS — E785 Hyperlipidemia, unspecified: Secondary | ICD-10-CM

## 2011-10-06 DIAGNOSIS — I10 Essential (primary) hypertension: Secondary | ICD-10-CM

## 2011-10-06 DIAGNOSIS — I2510 Atherosclerotic heart disease of native coronary artery without angina pectoris: Secondary | ICD-10-CM

## 2011-10-06 MED ORDER — METOPROLOL SUCCINATE ER 50 MG PO TB24: 50.0000 mg | ORAL_TABLET | Freq: Every day | ORAL | Status: DC

## 2011-10-06 MED ORDER — CLOPIDOGREL BISULFATE 75 MG PO TABS: 75.0000 mg | ORAL_TABLET | Freq: Every day | ORAL | Status: DC

## 2011-10-06 NOTE — Patient Instructions (Signed)
You are doing well. No medication changes were made.  Please call us if you have new issues that need to be addressed before your next appt.  Your physician wants you to follow-up in: 6 months.  You will receive a reminder letter in the mail two months in advance. If you don't receive a letter, please call our office to schedule the follow-up appointment.   

## 2011-10-06 NOTE — Progress Notes (Signed)
Patient ID: Leonard Hughes, male    DOB: 13-Apr-1946, 66 y.o.   MRN: [id number]  HPI Comments: Leonard Hughes is a very pleasant 66 year old gentleman with past medical history coronary artery disease, severe proximal LAD disease in 2001 with stent placed at that time, stress test showing ischemia in 2007 leading to catheterization and eventual bypass surgery by Dr. Dorris Fetch in March of 2007  for 75% left main disease, history of obesity, and strong family history of coronary artery disease who presents for follow up.    Leonard Hughes states that currently he feels well. He has never had chest pain prior to his stress test though his stress tests have come back positive both in 2001 and in  2007. 2001-lead to a stent in 2007, his bypass surgery. He is otherwise active and denies any significant shortness of breath. He has been tolerating Lipitor 40 mg daily with no muscle ache.    EKG shows normal sinus rhythm with rate 56 beats per minute with no significant ST or T wave changes     Outpatient Encounter Prescriptions as of 64/13/2013  Medication Sig Dispense Refill  . aspirin 81 MG EC tablet Take 81 mg by mouth daily.        Marland Kitchen atorvastatin (LIPITOR) 40 MG tablet Take 40 mg by mouth daily.      . clopidogrel (PLAVIX) 75 MG tablet Take 1 tablet (75 mg total) by mouth daily.  90 tablet  3  . co-enzyme Q-10 50 MG capsule Take 50 mg by mouth 2 (two) times daily.      . metoprolol succinate (TOPROL-XL) 50 MG 24 hr tablet Take 1 tablet (50 mg total) by mouth daily.  90 tablet  3  . Multiple Vitamin (MULTIVITAMIN) tablet Take 1 tablet by mouth daily.        . Omega-3 Fatty Acids (FISH OIL) 1000 MG CAPS Take 2 capsules by mouth daily.          Review of Systems  Constitutional: Negative.   HENT: Negative.   Eyes: Negative.   Respiratory: Negative.   Cardiovascular: Negative.   Gastrointestinal: Negative.   Skin: Negative.   Neurological: Negative.   Hematological: Negative.     Psychiatric/Behavioral: Negative.   All other systems reviewed and are negative.    BP 142/78  Pulse 56  Ht 5\' 11"  (1.803 m)  Wt 226 lb (102.513 kg)  BMI 31.52 kg/m2  Physical Exam  Nursing note and vitals reviewed. Constitutional: He is oriented to person, place, and time. He appears well-developed and well-nourished.  HENT:  Head: Normocephalic.  Nose: Nose normal.  Mouth/Throat: Oropharynx is clear and moist.  Eyes: Conjunctivae are normal. Pupils are equal, round, and reactive to light.  Neck: Normal range of motion. Neck supple. No JVD present.  Cardiovascular: Normal rate, regular rhythm, S1 normal, S2 normal, normal heart sounds and intact distal pulses.  Exam reveals no gallop and no friction rub.   No murmur heard. Pulmonary/Chest: Effort normal and breath sounds normal. No respiratory distress. He has no wheezes. He has no rales. He exhibits no tenderness.  Abdominal: Soft. Bowel sounds are normal. He exhibits no distension. There is no tenderness.  Musculoskeletal: Normal range of motion. He exhibits no edema and no tenderness.  Lymphadenopathy:    He has no cervical adenopathy.  Neurological: He is alert and oriented to person, place, and time. Coordination normal.  Skin: Skin is warm and dry. No rash noted. No erythema.  Psychiatric: He  has a normal mood and affect. His behavior is normal. Judgment and thought content normal.           Assessment and Plan

## 2011-10-06 NOTE — Assessment & Plan Note (Signed)
Blood pressure is well controlled on today's visit. No changes made to the medications. 

## 2011-10-06 NOTE — Assessment & Plan Note (Signed)
Currently with no symptoms of angina. No further workup at this time. Continue current medication regimen. 

## 2011-10-06 NOTE — Assessment & Plan Note (Signed)
We will check his cholesterol on today's visit. Goal LDL less than 70.

## 2011-10-08 LAB — LIPID PANEL
Chol/HDL Ratio: 4.6 ratio units (ref 0.0–5.0)
Cholesterol, Total: 166 mg/dL (ref 100–199)
Triglycerides: 189 mg/dL — ABNORMAL HIGH (ref 0–149)

## 2011-10-08 LAB — HEPATIC FUNCTION PANEL: Total Protein: 7.1 g/dL (ref 6.0–8.5)

## 2011-10-17 ENCOUNTER — Telehealth: Payer: Self-pay | Admitting: *Deleted

## 2011-10-17 NOTE — Telephone Encounter (Signed)
Message copied by Tarri Fuller on Fri Oct 17, 2011  3:36 PM ------      Message from: Phoebe Sharps      Created: Sun Oct 12, 2011  1:35 PM       Cholesterol too high      Need LDL <70      He could increase lipitor to 80 mg daily      Weight loss      If still elevated in 6 months,      May need to change medication to nongeneric pill

## 2011-10-17 NOTE — Telephone Encounter (Signed)
lmom ptcb to go ver lab results and to increase lipitor 80 mg daily

## 2011-10-22 NOTE — Progress Notes (Signed)
lmtcb

## 2011-10-23 ENCOUNTER — Telehealth: Payer: Self-pay

## 2011-10-23 NOTE — Telephone Encounter (Signed)
Message copied by Marcelle Overlie on Thu Oct 23, 2011 12:19 PM ------      Message from: Jaleea Alesi E      Created: Wed Oct 22, 2011  8:55 AM       LMTCB on mobile #

## 2011-10-23 NOTE — Progress Notes (Signed)
lmtcb

## 2011-12-01 ENCOUNTER — Other Ambulatory Visit: Payer: Self-pay | Admitting: Cardiovascular Disease

## 2011-12-01 MED ORDER — ATORVASTATIN CALCIUM 80 MG PO TABS: 80.0000 mg | ORAL_TABLET | Freq: Every day | ORAL | Status: DC

## 2011-12-01 NOTE — Telephone Encounter (Signed)
Pt needs 80 mg called in per last labs

## 2011-12-01 NOTE — Telephone Encounter (Signed)
Refilled Lipitor

## 2012-04-06 ENCOUNTER — Ambulatory Visit (INDEPENDENT_AMBULATORY_CARE_PROVIDER_SITE_OTHER): Payer: 59 | Admitting: Cardiovascular Disease

## 2012-04-06 ENCOUNTER — Encounter: Payer: Self-pay | Admitting: Cardiovascular Disease

## 2012-04-06 VITALS — BP 130/80 | HR 61 | Ht 71.0 in | Wt 225.0 lb

## 2012-04-06 DIAGNOSIS — I25810 Atherosclerosis of coronary artery bypass graft(s) without angina pectoris: Secondary | ICD-10-CM

## 2012-04-06 DIAGNOSIS — I10 Essential (primary) hypertension: Secondary | ICD-10-CM

## 2012-04-06 DIAGNOSIS — E785 Hyperlipidemia, unspecified: Secondary | ICD-10-CM

## 2012-04-06 DIAGNOSIS — I2510 Atherosclerotic heart disease of native coronary artery without angina pectoris: Secondary | ICD-10-CM

## 2012-04-06 NOTE — Assessment & Plan Note (Signed)
Currently with no symptoms of angina. No further workup at this time. Continue current medication regimen. 

## 2012-04-06 NOTE — Progress Notes (Signed)
Patient ID: Leonard Hughes, male    DOB: September 17, 1945, 66 y.o.   MRN: [id number]  HPI Comments: Leonard Hughes is a very pleasant 66 year old gentleman with past medical history coronary artery disease, severe proximal LAD disease in 2001 with stent placed at that time, stress test showing ischemia in 2007 leading to catheterization and eventual bypass surgery by Dr. Dorris Fetch in March of 2007  for 75% left main disease, history of obesity, and strong family history of coronary artery disease who presents for follow up.     stress tests have come back positive both in 2001 and in  2007. 2001-lead to a stent in 2007, his bypass surgery.  He is otherwise active and denies any significant shortness of breath. He has been tolerating Lipitor  80 mg daily with no muscle ache. He does have chronic mild leg cramping. Also with some twinges in the chest which she attributes to surgery.    EKG shows normal sinus rhythm with rate 61 beats per minute with no significant ST or T wave changes     Outpatient Encounter Prescriptions as of 04/06/2012  Medication Sig Dispense Refill  . aspirin 81 MG EC tablet Take 81 mg by mouth daily.        Marland Kitchen atorvastatin (LIPITOR) 80 MG tablet Take 1 tablet (80 mg total) by mouth daily.  30 tablet  6  . clopidogrel (PLAVIX) 75 MG tablet Take 1 tablet (75 mg total) by mouth daily.  90 tablet  3  . metoprolol succinate (TOPROL-XL) 50 MG 24 hr tablet Take 1 tablet (50 mg total) by mouth daily.  90 tablet  3  . Multiple Vitamin (MULTIVITAMIN) tablet Take 1 tablet by mouth daily.           Review of Systems  Constitutional: Negative.   HENT: Negative.   Eyes: Negative.   Respiratory: Negative.   Cardiovascular: Negative.   Gastrointestinal: Negative.   Skin: Negative.   Neurological: Negative.   Hematological: Negative.   Psychiatric/Behavioral: Negative.   All other systems reviewed and are negative.    BP 130/80  Pulse 61  Ht 5\' 11"  (1.803 m)  Wt 225 lb (102.059  kg)  BMI 31.38 kg/m2  Physical Exam  Nursing note and vitals reviewed. Constitutional: He is oriented to person, place, and time. He appears well-developed and well-nourished.  HENT:  Head: Normocephalic.  Nose: Nose normal.  Mouth/Throat: Oropharynx is clear and moist.  Eyes: Conjunctivae normal are normal. Pupils are equal, round, and reactive to light.  Neck: Normal range of motion. Neck supple. No JVD present.  Cardiovascular: Normal rate, regular rhythm, S1 normal, S2 normal, normal heart sounds and intact distal pulses.  Exam reveals no gallop and no friction rub.   No murmur heard. Pulmonary/Chest: Effort normal and breath sounds normal. No respiratory distress. He has no wheezes. He has no rales. He exhibits no tenderness.  Abdominal: Soft. Bowel sounds are normal. He exhibits no distension. There is no tenderness.  Musculoskeletal: Normal range of motion. He exhibits no edema and no tenderness.  Lymphadenopathy:    He has no cervical adenopathy.  Neurological: He is alert and oriented to person, place, and time. Coordination normal.  Skin: Skin is warm and dry. No rash noted. No erythema.  Psychiatric: He has a normal mood and affect. His behavior is normal. Judgment and thought content normal.           Assessment and Plan

## 2012-04-06 NOTE — Assessment & Plan Note (Signed)
Blood pressure is well controlled on today's visit. No changes made to the medications. 

## 2012-04-06 NOTE — Patient Instructions (Addendum)
You are doing well. No medication changes were made.  Please come in to check cholesterol in the next few weeks  Please call us if you have new issues that need to be addressed before your next appt.  Your physician wants you to follow-up in: 6 months.  You will receive a reminder letter in the mail two months in advance. If you don't receive a letter, please call our office to schedule the follow-up appointment.

## 2012-04-06 NOTE — Assessment & Plan Note (Addendum)
Cholesterol has been above goal. We have suggested he come in to have his cholesterol checked at his convenience. Goal LDL less than 70.

## 2012-04-08 ENCOUNTER — Ambulatory Visit (INDEPENDENT_AMBULATORY_CARE_PROVIDER_SITE_OTHER): Payer: 59

## 2012-04-08 DIAGNOSIS — I2510 Atherosclerotic heart disease of native coronary artery without angina pectoris: Secondary | ICD-10-CM

## 2012-04-08 DIAGNOSIS — I10 Essential (primary) hypertension: Secondary | ICD-10-CM

## 2012-04-09 ENCOUNTER — Other Ambulatory Visit: Payer: Self-pay

## 2012-04-09 LAB — LIPID PANEL
Chol/HDL Ratio: 4.5 ratio units (ref 0.0–5.0)
LDL Calculated: 51 mg/dL (ref 0–99)

## 2012-04-09 LAB — HEPATIC FUNCTION PANEL: Bilirubin, Direct: 0.14 mg/dL (ref 0.00–0.40)

## 2012-04-09 MED ORDER — OMEGA-3 FATTY ACIDS 1000 MG PO CAPS: 2.0000 g | ORAL_CAPSULE | Freq: Every day | ORAL | Status: DC

## 2012-06-07 ENCOUNTER — Other Ambulatory Visit: Payer: Self-pay | Admitting: *Deleted

## 2012-06-07 MED ORDER — METOPROLOL SUCCINATE ER 50 MG PO TB24: 50.0000 mg | ORAL_TABLET | Freq: Every day | ORAL | Status: DC

## 2012-06-07 MED ORDER — ATORVASTATIN CALCIUM 80 MG PO TABS: 80.0000 mg | ORAL_TABLET | Freq: Every day | ORAL | Status: DC

## 2012-06-07 MED ORDER — CLOPIDOGREL BISULFATE 75 MG PO TABS: 75.0000 mg | ORAL_TABLET | Freq: Every day | ORAL | Status: DC

## 2012-06-07 NOTE — Telephone Encounter (Signed)
Refilled Atorvastatin.

## 2012-06-07 NOTE — Telephone Encounter (Signed)
Refilled Metoprolol, Atorvastatin and Clopidogrel.

## 2012-06-07 NOTE — Telephone Encounter (Signed)
Needs 30 day until mail order arrives

## 2012-06-28 ENCOUNTER — Ambulatory Visit (INDEPENDENT_AMBULATORY_CARE_PROVIDER_SITE_OTHER): Payer: 59 | Admitting: Family Medicine

## 2012-06-28 ENCOUNTER — Encounter: Payer: Self-pay | Admitting: Family Medicine

## 2012-06-28 VITALS — BP 144/80 | HR 64 | Temp 98.3°F | Ht 71.0 in | Wt 227.5 lb

## 2012-06-28 DIAGNOSIS — Z125 Encounter for screening for malignant neoplasm of prostate: Secondary | ICD-10-CM

## 2012-06-28 DIAGNOSIS — Z23 Encounter for immunization: Secondary | ICD-10-CM

## 2012-06-28 DIAGNOSIS — I10 Essential (primary) hypertension: Secondary | ICD-10-CM

## 2012-06-28 DIAGNOSIS — Z1211 Encounter for screening for malignant neoplasm of colon: Secondary | ICD-10-CM

## 2012-06-28 DIAGNOSIS — Z0000 Encounter for general adult medical examination without abnormal findings: Secondary | ICD-10-CM | POA: Insufficient documentation

## 2012-06-28 DIAGNOSIS — E785 Hyperlipidemia, unspecified: Secondary | ICD-10-CM

## 2012-06-28 LAB — BASIC METABOLIC PANEL
CO2: 27 mEq/L (ref 19–32)
Calcium: 9.1 mg/dL (ref 8.4–10.5)
Chloride: 106 mEq/L (ref 96–112)
Glucose, Bld: 105 mg/dL — ABNORMAL HIGH (ref 70–99)
Sodium: 142 mEq/L (ref 135–145)

## 2012-06-28 LAB — PSA: PSA: 1.41 ng/mL (ref 0.10–4.00)

## 2012-06-28 MED ORDER — HYDROCORTISONE 2.5 % RE CREA
TOPICAL_CREAM | Freq: Two times a day (BID) | RECTAL | Status: DC
Start: 2012-06-28 — End: 2013-06-28

## 2012-06-28 NOTE — Assessment & Plan Note (Addendum)
Preventative protocols reviewed and updated unless pt declined. Discussed healthy diet and lifestyle. Flu shot today. Mood issues - since CABG, but manageable.  PHQ9 = 5/27, GAD7 = 11/21.  Currently not interested in medical treatment.  Consider counseling if persistent issue or pt desires.

## 2012-06-28 NOTE — Patient Instructions (Addendum)
Call your insurance about the shingles shot to see if it is covered or how much it would cost and where is cheaper (here or pharmacy).  If you want to receive here, call for nurse visit. Good to see you today, call us with questions. stool kit today. May try anusol for hemorrhoids when inflamed or irritated. Flu shot today. Blood work today. Return as needed or in 1 year for next physical.

## 2012-06-28 NOTE — Progress Notes (Signed)
Subjective:    Patient ID: Leonard Hughes, male    DOB: 1946-04-06, 67 y.o.   MRN: [id number]  HPI CC: new pt to establish, desires CPE  [facility].    CAD s/p CABG 1v 2007.  H/o stent in past.  Dr. Orson Aloe.  Followed by Dr. Mariah Milling.  No chest pain, tightness, SOB.  Preventative: No CPE in last 10 yrs. Flu shot - would like today. Tetanus - unsure Pneumovax - has not had Shingles shot - interested - will check with insurance. Colon cancer screening - would like stool kit.  No blood in stool, no fmhx colon cancer. Prostate cancer screening - PSA in past normal.  Would like to continue screening.  Some irritability endorsed by wife.  Wonders if happened after CABG.  Denies sadness, anhedonia.  Caffeine: 2-3 cups coffee/day Lives with wife, mother in law, cats and dog.  grown daughter Occupation: Airline pilot, Scientist, water quality Edu: BA at [employer] Activity: walking 2 mi 5d/wk Diet: good water, fruits/vegetables daily  Medications and allergies reviewed and updated in chart.  Past histories reviewed and updated if relevant as below. Patient Active Problem List  Diagnosis  . HYPERLIPIDEMIA-MIXED  . HYPERTENSION, BENIGN  . CAD, UNSPECIFIED SITE  . CAD, ARTERY BYPASS GRAFT  . Fatigue   Past Medical History  Diagnosis Date  . Coronary artery disease 2007    CABG 1v Orson Aloe)  . Hyperlipidemia   . Hypertension   . History of chicken pox    Past Surgical History  Procedure Date  . Coronary artery bypass graft 2007    [place]  . Tonsillectomy    History  Substance Use Topics  . Smoking status: Former Smoker -- 1.0 packs/day for 3 years    Types: Cigarettes    Quit date: 08/26/1979  . Smokeless tobacco: Never Used  . Alcohol Use: 0.5 oz/week    1 drink(s) per week     Comment: Socially   Family History  Problem Relation Age of Onset  . Heart disease Mother     ICD and stents  . CAD Father   . CAD Sister     triple bypass  . CAD Brother   . Diabetes  Neg Hx   . Cancer Neg Hx   . Stroke Mother     possibly   No Known Allergies Current Outpatient Prescriptions on File Prior to Visit  Medication Sig Dispense Refill  . aspirin 81 MG EC tablet Take 81 mg by mouth daily.        Marland Kitchen atorvastatin (LIPITOR) 80 MG tablet Take 1 tablet (80 mg total) by mouth daily.  90 tablet  6  . clopidogrel (PLAVIX) 75 MG tablet Take 1 tablet (75 mg total) by mouth daily.  90 tablet  3  . fish oil-omega-3 fatty acids 1000 MG capsule Take 2 capsules (2 g total) by mouth daily.  30 capsule  3  . metoprolol succinate (TOPROL-XL) 50 MG 24 hr tablet Take 1 tablet (50 mg total) by mouth daily.  90 tablet  3  . Multiple Vitamin (MULTIVITAMIN) tablet Take 1 tablet by mouth daily.           Review of Systems  Constitutional: Negative for fever, chills, activity change, appetite change, fatigue and unexpected weight change.  HENT: Negative for hearing loss and neck pain.   Eyes: Negative for visual disturbance.  Respiratory: Negative for cough, chest tightness, shortness of breath and wheezing.   Cardiovascular: Negative for chest pain, palpitations  and leg swelling.  Gastrointestinal: Negative for nausea, vomiting, abdominal pain, diarrhea, constipation, blood in stool and abdominal distention.  Genitourinary: Negative for hematuria and difficulty urinating.  Musculoskeletal: Negative for myalgias and arthralgias.  Skin: Negative for rash.  Neurological: Negative for dizziness, seizures, syncope and headaches.  Hematological: Does not bruise/bleed easily.  Psychiatric/Behavioral: Negative for dysphoric mood. The patient is not nervous/anxious.        Objective:   Physical Exam  Nursing note and vitals reviewed. Constitutional: He is oriented to person, place, and time. He appears well-developed and well-nourished. No distress.  HENT:  Head: Normocephalic and atraumatic.  Right Ear: Hearing, tympanic membrane, external ear and ear canal normal.  Left Ear:  Hearing, tympanic membrane, external ear and ear canal normal.  Nose: Nose normal.  Mouth/Throat: Oropharynx is clear and moist. No oropharyngeal exudate.  Eyes: Conjunctivae normal and EOM are normal. Pupils are equal, round, and reactive to light. No scleral icterus.  Neck: Normal range of motion. Neck supple. Carotid bruit is not present. No thyromegaly present.  Cardiovascular: Normal rate, regular rhythm, normal heart sounds and intact distal pulses.   No murmur heard. Pulses:      Radial pulses are 2+ on the right side, and 2+ on the left side.  Pulmonary/Chest: Effort normal and breath sounds normal. No respiratory distress. He has no wheezes. He has no rales.  Abdominal: Soft. Bowel sounds are normal. He exhibits no distension and no mass. There is no tenderness. There is no rebound and no guarding.  Genitourinary: Rectum normal and prostate normal. Rectal exam shows no external hemorrhoid (non inflamed), no internal hemorrhoid, no fissure, no mass, no tenderness and anal tone normal. Prostate is not enlarged (15-20gm) and not tender.  Musculoskeletal: Normal range of motion. He exhibits no edema.  Lymphadenopathy:    He has no cervical adenopathy.  Neurological: He is alert and oriented to person, place, and time.       CN grossly intact, station and gait intact  Skin: Skin is warm and dry. No rash noted.  Psychiatric: He has a normal mood and affect. His behavior is normal. Judgment and thought content normal.       Assessment & Plan:

## 2012-06-28 NOTE — Assessment & Plan Note (Signed)
Compliant with lipitor. Has started fish oil 2gm daily.

## 2012-06-28 NOTE — Assessment & Plan Note (Signed)
Chronic. Slightly elevated but adequate today. BP Readings from Last 3 Encounters:  06/28/12 144/80  04/06/12 130/80  10/06/11 142/78

## 2012-06-29 ENCOUNTER — Encounter: Payer: Self-pay | Admitting: *Deleted

## 2012-07-06 ENCOUNTER — Encounter: Payer: Self-pay | Admitting: Family Medicine

## 2012-07-12 ENCOUNTER — Other Ambulatory Visit: Payer: Self-pay | Admitting: Family Medicine

## 2013-03-31 ENCOUNTER — Other Ambulatory Visit: Payer: Self-pay

## 2013-04-18 ENCOUNTER — Other Ambulatory Visit: Payer: Self-pay | Admitting: Cardiovascular Disease

## 2013-04-28 ENCOUNTER — Encounter: Payer: Self-pay | Admitting: Cardiovascular Disease

## 2013-04-28 ENCOUNTER — Ambulatory Visit (INDEPENDENT_AMBULATORY_CARE_PROVIDER_SITE_OTHER): Payer: 59 | Admitting: Cardiovascular Disease

## 2013-04-28 VITALS — BP 150/80 | HR 58 | Ht 71.0 in | Wt 219.5 lb

## 2013-04-28 DIAGNOSIS — E785 Hyperlipidemia, unspecified: Secondary | ICD-10-CM

## 2013-04-28 DIAGNOSIS — I499 Cardiac arrhythmia, unspecified: Secondary | ICD-10-CM

## 2013-04-28 DIAGNOSIS — I10 Essential (primary) hypertension: Secondary | ICD-10-CM

## 2013-04-28 DIAGNOSIS — I25810 Atherosclerosis of coronary artery bypass graft(s) without angina pectoris: Secondary | ICD-10-CM

## 2013-04-28 NOTE — Progress Notes (Signed)
Patient ID: Leonard Hughes, male    DOB: Nov 28, 1945, 67 y.o.   MRN: [id number]  HPI Comments: Leonard Hughes is a very pleasant 67 year old gentleman with past medical history of coronary artery disease, severe proximal LAD disease in 2001 with stent placed at that time, stress test showing ischemia in 2007 leading to catheterization and eventual bypass surgery by Dr. Dorris Fetch in March of 2007  for 75% left main disease, history of obesity, and strong family history of coronary artery disease who presents for follow up.     stress tests have come back positive both in 2001 and in  2007. 2001-lead to a stent in 2007, his bypass surgery.   In followup today, he is active and denies any significant shortness of breath. He has been tolerating Lipitor  80 mg daily with no muscle ache no concern the medication could be causing foot numbness. He has not tried a lower dose recently.  Cholesterol in the high dose has achieved lipid goal. Total cholesterol less than 150, currently in the 130 range on last check He continues to work full-time    EKG shows normal sinus rhythm with rate 58 beats per minute with no significant ST or T wave changes     Outpatient Encounter Prescriptions as of 04/28/2013  Medication Sig  . aspirin 81 MG EC tablet Take 81 mg by mouth daily.    Marland Kitchen atorvastatin (LIPITOR) 80 MG tablet Take 1 tablet (80 mg total) by mouth daily.  . clopidogrel (PLAVIX) 75 MG tablet Take 1 tablet (75 mg total) by mouth daily.  . fish oil-omega-3 fatty acids 1000 MG capsule Take 2 capsules (2 g total) by mouth daily.  . hydrocortisone (ANUSOL-HC) 2.5 % rectal cream Place rectally 2 (two) times daily.  . metoprolol succinate (TOPROL-XL) 50 MG 24 hr tablet Take 1 tablet (50 mg total) by mouth daily.  . Multiple Vitamin (MULTIVITAMIN) tablet Take 1 tablet by mouth daily.        Review of Systems  Constitutional: Negative.   HENT: Negative.   Eyes: Negative.   Respiratory: Negative.    Cardiovascular: Negative.   Gastrointestinal: Negative.   Skin: Negative.   Neurological: Negative.        Foot numbness  Psychiatric/Behavioral: Negative.   All other systems reviewed and are negative.    BP 150/80  Pulse 58  Ht 5\' 11"  (1.803 m)  Wt 219 lb 8 oz (99.565 kg)  BMI 30.63 kg/m2  Physical Exam  Nursing note and vitals reviewed. Constitutional: He is oriented to person, place, and time. He appears well-developed and well-nourished.  HENT:  Head: Normocephalic.  Nose: Nose normal.  Mouth/Throat: Oropharynx is clear and moist.  Eyes: Conjunctivae are normal. Pupils are equal, round, and reactive to light.  Neck: Normal range of motion. Neck supple. No JVD present.  Cardiovascular: Normal rate, regular rhythm, S1 normal, S2 normal, normal heart sounds and intact distal pulses.  Exam reveals no gallop and no friction rub.   No murmur heard. Pulmonary/Chest: Effort normal and breath sounds normal. No respiratory distress. He has no wheezes. He has no rales. He exhibits no tenderness.  Abdominal: Soft. Bowel sounds are normal. He exhibits no distension. There is no tenderness.  Musculoskeletal: Normal range of motion. He exhibits no edema and no tenderness.  Lymphadenopathy:    He has no cervical adenopathy.  Neurological: He is alert and oriented to person, place, and time. Coordination normal.  Skin: Skin is warm and dry. No  rash noted. No erythema.  Psychiatric: He has a normal mood and affect. His behavior is normal. Judgment and thought content normal.      Assessment and Plan

## 2013-04-28 NOTE — Assessment & Plan Note (Signed)
Currently with no symptoms of angina. No further workup at this time. Continue current medication regimen. 

## 2013-04-28 NOTE — Assessment & Plan Note (Addendum)
If foot numbness gets worse, suggested he hold his cholesterol medication for several weeks to see if symptoms improve

## 2013-04-28 NOTE — Assessment & Plan Note (Signed)
Blood pressure is well controlled on today's visit. No changes made to the medications. 

## 2013-04-28 NOTE — Patient Instructions (Signed)
You are doing well. No medication changes were made.  Check the price of zetia (to take with lower dose lipitor) Ok to hold the lipitor for a short period of time to check if foot discomfort improves  Please call us if you have new issues that need to be addressed before your next appt.  Your physician wants you to follow-up in: 6 months.  You will receive a reminder letter in the mail two months in advance. If you don't receive a letter, please call our office to schedule the follow-up appointment.

## 2013-06-22 ENCOUNTER — Other Ambulatory Visit: Payer: Self-pay | Admitting: Family Medicine

## 2013-06-22 DIAGNOSIS — E785 Hyperlipidemia, unspecified: Secondary | ICD-10-CM

## 2013-06-22 DIAGNOSIS — I10 Essential (primary) hypertension: Secondary | ICD-10-CM

## 2013-06-22 DIAGNOSIS — Z125 Encounter for screening for malignant neoplasm of prostate: Secondary | ICD-10-CM

## 2013-06-24 ENCOUNTER — Other Ambulatory Visit (INDEPENDENT_AMBULATORY_CARE_PROVIDER_SITE_OTHER): Payer: 59

## 2013-06-24 DIAGNOSIS — Z125 Encounter for screening for malignant neoplasm of prostate: Secondary | ICD-10-CM

## 2013-06-24 DIAGNOSIS — E785 Hyperlipidemia, unspecified: Secondary | ICD-10-CM

## 2013-06-24 DIAGNOSIS — I10 Essential (primary) hypertension: Secondary | ICD-10-CM

## 2013-06-24 LAB — LIPID PANEL
CHOLESTEROL: 132 mg/dL (ref 0–200)
HDL: 31.3 mg/dL — ABNORMAL LOW (ref >39.00)
LDL Cholesterol: 74 mg/dL (ref 0–99)
Total CHOL/HDL Ratio: 4
Triglycerides: 134 mg/dL (ref 0.0–149.0)
VLDL: 26.8 mg/dL (ref 0.0–40.0)

## 2013-06-24 LAB — BASIC METABOLIC PANEL
BUN: 14 mg/dL (ref 6–23)
CHLORIDE: 105 meq/L (ref 96–112)
CO2: 31 mEq/L (ref 19–32)
Calcium: 9.5 mg/dL (ref 8.4–10.5)
Creatinine, Ser: 1.1 mg/dL (ref 0.4–1.5)
GFR: 75.25 mL/min (ref >60.00)
Glucose, Bld: 101 mg/dL — ABNORMAL HIGH (ref 70–99)
Potassium: 4.8 mEq/L (ref 3.5–5.1)
Sodium: 140 mEq/L (ref 135–145)

## 2013-06-24 LAB — PSA: PSA: 1.35 ng/mL (ref 0.10–4.00)

## 2013-06-28 ENCOUNTER — Ambulatory Visit (INDEPENDENT_AMBULATORY_CARE_PROVIDER_SITE_OTHER): Payer: 59 | Admitting: Family Medicine

## 2013-06-28 ENCOUNTER — Encounter: Payer: Self-pay | Admitting: Family Medicine

## 2013-06-28 VITALS — BP 130/82 | HR 54 | Temp 97.8°F | Ht 70.0 in | Wt 224.5 lb

## 2013-06-28 DIAGNOSIS — R519 Headache, unspecified: Secondary | ICD-10-CM

## 2013-06-28 DIAGNOSIS — M79672 Pain in left foot: Secondary | ICD-10-CM

## 2013-06-28 DIAGNOSIS — M79671 Pain in right foot: Secondary | ICD-10-CM

## 2013-06-28 DIAGNOSIS — Z0000 Encounter for general adult medical examination without abnormal findings: Secondary | ICD-10-CM

## 2013-06-28 DIAGNOSIS — M26629 Arthralgia of temporomandibular joint, unspecified side: Secondary | ICD-10-CM | POA: Insufficient documentation

## 2013-06-28 DIAGNOSIS — Z23 Encounter for immunization: Secondary | ICD-10-CM

## 2013-06-28 DIAGNOSIS — R202 Paresthesia of skin: Secondary | ICD-10-CM | POA: Insufficient documentation

## 2013-06-28 DIAGNOSIS — R51 Headache: Secondary | ICD-10-CM

## 2013-06-28 NOTE — Assessment & Plan Note (Signed)
Preventative protocols reviewed and updated unless pt declined. Discussed healthy diet and lifestyle.  

## 2013-06-28 NOTE — Patient Instructions (Addendum)
Call your insurance about the shingles shot to see if it is covered or how much it would cost and where is cheaper (here or pharmacy).  If you want to receive here, call for nurse visit. Pneumovax today (pneumonia shot).  Flu shot today. I wonder if you had a viral parotitis.  If not improving with time over next few weeks, let me know for referral to ENT.  May use tylenol for discomfort as needed. Good to see you today, call us with quesitons. Return as needed or in 1 year for next physical.

## 2013-06-28 NOTE — Assessment & Plan Note (Signed)
Anticipate statin induced cramping pain as it did seem to resolve with 2 wk break off statin. Reviewed FLP - do recommend continued statin given cardiac history - will need high intensity statin to achieve goal - recommended continue lipitor 80, may add co Q 10  Intolerant to crestor in past.

## 2013-06-28 NOTE — Assessment & Plan Note (Addendum)
Normal TMJ, TM on exam today.  Some swelling of L parotid gland evident on exam, but no significant erythema, tenderness.  No frank mass or LAD appreciated today. ?viral parotitis during recent trip [place] - will continue to monitor for now, discussed if not better in next few weeks to notify me for possible ENT referral - would want to r/o parotid mass. Pt agrees with plan.

## 2013-06-28 NOTE — Progress Notes (Signed)
Pre-visit discussion using our clinic review tool. No additional management support is needed unless otherwise documented below in the visit note.  

## 2013-06-28 NOTE — Addendum Note (Signed)
Addended by: Josph MachoANCE, KIMBERLY A on: 06/28/2013 11:42 AM   Modules accepted: Orders

## 2013-06-28 NOTE — Progress Notes (Signed)
Subjective:    Patient ID: Leonard Hughes, male    DOB: [birth year], 68 y.o.   MRN: [id number]  HPI CC: CPE  1.5 mo ago had L ear ache after trip to [place] for daughter's wedding.  Was not evaluated for this.  Progressively improving.  Persistent mild irritation of ear along with L jaw pain.  Saw dentist last week - told teeth looked ok.  Denies redness or fever.  Self treated with a few doses of wife's amoxicillin.  Nothing ever drained.  HLD - lipitor 77m causing foot cramping.  Stopped for 2 wks and cramping improved.  However he restarted med and some cramping has returned. Describes some numbness in balls of feet bilaterally.  Also occasional cramping.  CAD s/p CABG 1v 2007. H/o stent in past. Dr. HKoleen Nimrod Followed by Dr. GRockey Situ No chest pain, tightness, SOB.   Preventative:  Colon cancer screening - would like stool kit. No blood in stool, no fmhx colon cancer.  Has already picked this up. Prostate cancer screening - PSA in past normal. Would like to continue screening. Flu shot - would like today. Tetanus - Possibly last 2007.  Will defer Tdap. Pneumovax - pneumovax. Shingles shot - interested - will check with insurance.  Caffeine: 2-3 cups coffee/day  Lives with wife, mother in law, cats and dog. grown daughter  Occupation: sPress photographer aPassenger transport manager Edu: BA at [employer] Activity: walking 2 mi 5d/wk  Diet: good water, fruits/vegetables daily   Medications and allergies reviewed and updated in chart.  Past histories reviewed and updated if relevant as below. Patient Active Problem List   Diagnosis Date Noted  . Healthcare maintenance 06/28/2012  . HYPERLIPIDEMIA-MIXED 07/03/2009  . HYPERTENSION, BENIGN 07/03/2009  . CAD, ARTERY BYPASS GRAFT 07/03/2009   Past Medical History  Diagnosis Date  . Coronary artery disease 2007    CABG 2v ([place] [facility]  . Hyperlipidemia   . Hypertension   . History of chicken pox    Past Surgical History  Procedure Laterality Date  .  Percutaneous coronary stent intervention (pci-s)  2001    mid L circ  . Coronary artery bypass graft  2007    2 vessel, [place]  . Tonsillectomy     History  Substance Use Topics  . Smoking status: Former Smoker -- 1.00 packs/day for 3 years    Types: Cigarettes    Quit date: 08/26/1979  . Smokeless tobacco: Never Used  . Alcohol Use: 0.5 oz/week    1 drink(s) per week     Comment: Socially   Family History  Problem Relation Age of Onset  . Heart disease Mother     ICD and stents  . CAD Father 485   MI  . CAD Sister     triple bypass  . CAD Brother 472 . Stroke Mother     possibly  . Diabetes Neg Hx   . Cancer Neg Hx    No Known Allergies Current Outpatient Prescriptions on File Prior to Visit  Medication Sig Dispense Refill  . aspirin 81 MG EC tablet Take 81 mg by mouth daily.        .Marland Kitchenatorvastatin (LIPITOR) 80 MG tablet Take 1 tablet (80 mg total) by mouth daily.  90 tablet  6  . clopidogrel (PLAVIX) 75 MG tablet Take 1 tablet (75 mg total) by mouth daily.  90 tablet  3  . fish oil-omega-3 fatty acids 1000 MG capsule Take 2 capsules (2 g total)  by mouth daily.  30 capsule  3  . metoprolol succinate (TOPROL-XL) 50 MG 24 hr tablet Take 1 tablet (50 mg total) by mouth daily.  90 tablet  3  . Multiple Vitamin (MULTIVITAMIN) tablet Take 1 tablet by mouth daily.        . hydrocortisone (ANUSOL-HC) 2.5 % rectal cream Place rectally 2 (two) times daily.  30 g  0   No current facility-administered medications on file prior to visit.     Review of Systems  Constitutional: Negative for fever, chills, activity change, appetite change, fatigue and unexpected weight change.  HENT: Negative for hearing loss.   Eyes: Negative for visual disturbance.  Respiratory: Negative for cough, chest tightness, shortness of breath and wheezing.   Cardiovascular: Negative for chest pain, palpitations and leg swelling.  Gastrointestinal: Negative for nausea, vomiting, abdominal pain,  diarrhea, constipation, blood in stool and abdominal distention.  Genitourinary: Negative for hematuria and difficulty urinating.  Musculoskeletal: Negative for arthralgias, myalgias and neck pain.  Skin: Negative for rash.  Neurological: Negative for dizziness, seizures, syncope and headaches.  Hematological: Negative for adenopathy. Does not bruise/bleed easily.  Psychiatric/Behavioral: Negative for dysphoric mood. The patient is not nervous/anxious.        Objective:   Physical Exam  Nursing note and vitals reviewed. Constitutional: He is oriented to person, place, and time. He appears well-developed and well-nourished. No distress.  HENT:  Head: Normocephalic and atraumatic.  Right Ear: Hearing, tympanic membrane, external ear and ear canal normal.  Left Ear: Hearing, tympanic membrane, external ear and ear canal normal.  Nose: Nose normal.  Mouth/Throat: Uvula is midline, oropharynx is clear and moist and mucous membranes are normal. No oropharyngeal exudate, posterior oropharyngeal edema, posterior oropharyngeal erythema or tonsillar abscesses.  No obvious blockage of stetson's duct. No pain at TMJ - equal intact jaw movements bilaterally Some swelling of left parotid gland but no frank mass appreciated.  Eyes: Conjunctivae and EOM are normal. Pupils are equal, round, and reactive to light. No scleral icterus.  Neck: Normal range of motion. Neck supple. No thyromegaly present.  Cardiovascular: Normal rate, regular rhythm, normal heart sounds and intact distal pulses.   No murmur heard. Pulses:      Radial pulses are 2+ on the right side, and 2+ on the left side.  Pulmonary/Chest: Effort normal and breath sounds normal. No respiratory distress. He has no wheezes. He has no rales.  Abdominal: Soft. Bowel sounds are normal. He exhibits no distension and no mass. There is no tenderness. There is no rebound and no guarding.  Genitourinary: Rectum normal and prostate normal. Rectal  exam shows no external hemorrhoid, no internal hemorrhoid, no fissure, no mass, no tenderness and anal tone normal. Prostate is not enlarged (15gm, divet) and not tender.  Musculoskeletal: Normal range of motion. He exhibits no edema.  Lymphadenopathy:    He has no cervical adenopathy.  Neurological: He is alert and oriented to person, place, and time.  CN grossly intact, station and gait intact  Skin: Skin is warm and dry. No rash noted.  Psychiatric: He has a normal mood and affect. His behavior is normal. Judgment and thought content normal.       Assessment & Plan:

## 2013-07-10 ENCOUNTER — Other Ambulatory Visit: Payer: Self-pay | Admitting: Cardiovascular Disease

## 2014-01-31 ENCOUNTER — Other Ambulatory Visit: Payer: Self-pay | Admitting: Cardiovascular Disease

## 2014-06-13 ENCOUNTER — Other Ambulatory Visit: Payer: Self-pay | Admitting: Cardiovascular Disease

## 2014-06-24 ENCOUNTER — Other Ambulatory Visit: Payer: Self-pay | Admitting: Family Medicine

## 2014-06-24 DIAGNOSIS — Z125 Encounter for screening for malignant neoplasm of prostate: Secondary | ICD-10-CM

## 2014-06-24 DIAGNOSIS — E785 Hyperlipidemia, unspecified: Secondary | ICD-10-CM

## 2014-06-24 DIAGNOSIS — I10 Essential (primary) hypertension: Secondary | ICD-10-CM

## 2014-06-26 ENCOUNTER — Other Ambulatory Visit (INDEPENDENT_AMBULATORY_CARE_PROVIDER_SITE_OTHER): Payer: 59

## 2014-06-26 DIAGNOSIS — Z125 Encounter for screening for malignant neoplasm of prostate: Secondary | ICD-10-CM

## 2014-06-26 DIAGNOSIS — E785 Hyperlipidemia, unspecified: Secondary | ICD-10-CM

## 2014-06-26 LAB — LIPID PANEL
CHOL/HDL RATIO: 3
Cholesterol: 120 mg/dL (ref 0–200)
HDL: 34.8 mg/dL — ABNORMAL LOW (ref >39.00)
LDL Cholesterol: 55 mg/dL (ref 0–99)
NONHDL: 85.2
Triglycerides: 153 mg/dL — ABNORMAL HIGH (ref 0.0–149.0)
VLDL: 30.6 mg/dL (ref 0.0–40.0)

## 2014-06-26 LAB — COMPREHENSIVE METABOLIC PANEL
ALT: 35 U/L (ref 0–53)
AST: 24 U/L (ref 0–37)
Albumin: 4.2 g/dL (ref 3.5–5.2)
Alkaline Phosphatase: 63 U/L (ref 39–117)
BILIRUBIN TOTAL: 0.5 mg/dL (ref 0.2–1.2)
BUN: 12 mg/dL (ref 6–23)
CHLORIDE: 105 meq/L (ref 96–112)
CO2: 29 mEq/L (ref 19–32)
Calcium: 8.9 mg/dL (ref 8.4–10.5)
Creatinine, Ser: 0.96 mg/dL (ref 0.40–1.50)
GFR: 83.2 mL/min (ref >60.00)
GLUCOSE: 102 mg/dL — AB (ref 70–99)
Potassium: 4.8 mEq/L (ref 3.5–5.1)
Sodium: 140 mEq/L (ref 135–145)
Total Protein: 6.6 g/dL (ref 6.0–8.3)

## 2014-06-26 LAB — PSA, MEDICARE: PSA: 1.78 ng/ml (ref 0.10–4.00)

## 2014-07-03 ENCOUNTER — Ambulatory Visit (INDEPENDENT_AMBULATORY_CARE_PROVIDER_SITE_OTHER): Payer: 59 | Admitting: Family Medicine

## 2014-07-03 ENCOUNTER — Encounter: Payer: Self-pay | Admitting: Family Medicine

## 2014-07-03 VITALS — BP 136/76 | HR 60 | Temp 98.1°F | Ht 70.0 in | Wt 230.5 lb

## 2014-07-03 DIAGNOSIS — M26629 Arthralgia of temporomandibular joint, unspecified side: Secondary | ICD-10-CM

## 2014-07-03 DIAGNOSIS — I25810 Atherosclerosis of coronary artery bypass graft(s) without angina pectoris: Secondary | ICD-10-CM

## 2014-07-03 DIAGNOSIS — Z23 Encounter for immunization: Secondary | ICD-10-CM

## 2014-07-03 DIAGNOSIS — Z7189 Other specified counseling: Secondary | ICD-10-CM

## 2014-07-03 DIAGNOSIS — I10 Essential (primary) hypertension: Secondary | ICD-10-CM

## 2014-07-03 DIAGNOSIS — Z0000 Encounter for general adult medical examination without abnormal findings: Secondary | ICD-10-CM

## 2014-07-03 DIAGNOSIS — E785 Hyperlipidemia, unspecified: Secondary | ICD-10-CM

## 2014-07-03 DIAGNOSIS — Z1211 Encounter for screening for malignant neoplasm of colon: Secondary | ICD-10-CM

## 2014-07-03 MED ORDER — CYCLOBENZAPRINE HCL 10 MG PO TABS: 5.0000 mg | ORAL_TABLET | Freq: Three times a day (TID) | ORAL | Status: DC | PRN

## 2014-07-03 NOTE — Addendum Note (Signed)
Addended by: Josph MachoANCE, KIMBERLY A on: 07/03/2014 09:12 AM   Modules accepted: Orders

## 2014-07-03 NOTE — Assessment & Plan Note (Signed)
Anticipate TMJ dysfunction - treat with flexeril and discussed eccentric exercises to try. He has already seen dentist and ENT. Update if no better, consider PT vs referral back to dentistry.

## 2014-07-03 NOTE — Patient Instructions (Addendum)
prevnar today. Pass by lab for a stool kit Call your insurance about the shingles shot to see if it is covered or how much it would cost and where is cheaper (here or pharmacy).  If you want to receive here, call for nurse visit. If desired, return for nurse visit for shingles shot - 1 month after pneumonia shot. Bring me a copy of your living will.  Try flexeril for TMJ dysfunction. Do eccentric exercises discussed today. Let me know if not improving.  Temporomandibular Problems  Temporomandibular joint (TMJ) dysfunction means there are problems with the joint between your jaw and your skull. This is a joint lined by cartilage like other joints in your body but also has a small disc in the joint which keeps the bones from rubbing on each other. These joints are like other joints and can get inflamed (sore) from arthritis and other problems. When this joint gets sore, it can cause headaches and pain in the jaw and the face. CAUSES  Usually the arthritic types of problems are caused by soreness in the joint. Soreness in the joint can also be caused by overuse. This may come from grinding your teeth. It may also come from mis-alignment in the joint. DIAGNOSIS Diagnosis of this condition can often be made by history and exam. Sometimes your caregiver may need X-rays or an MRI scan to determine the exact cause. It may be necessary to see your dentist to determine if your teeth and jaws are lined up correctly. TREATMENT  Most of the time this problem is not serious; however, sometimes it can persist (become chronic). When this happens medications that will cut down on inflammation (soreness) help. Sometimes a shot of cortisone into the joint will be helpful. If your teeth are not aligned it may help for your dentist to make a splint for your mouth that can help this problem. If no physical problems can be found, the problem may come from tension. If tension is found to be the cause, biofeedback or  relaxation techniques may be helpful. HOME CARE INSTRUCTIONS   Later in the day, applications of ice packs may be helpful. Ice can be used in a plastic bag with a towel around it to prevent frostbite to skin. This may be used about every 2 hours for 20 to 30 minutes, as needed while awake, or as directed by your caregiver.  Only take over-the-counter or prescription medicines for pain, discomfort, or fever as directed by your caregiver.  If physical therapy was prescribed, follow your caregiver's directions.  Wear mouth appliances as directed if they were given. Document Released: 02/04/2001 Document Revised: 08/04/2011 Document Reviewed: 05/14/2008 [facility] Patient Information 2015 [place], [place]. This information is not intended to replace advice given to you by your health care provider. Make sure you discuss any questions you have with your health care provider.

## 2014-07-03 NOTE — Assessment & Plan Note (Signed)
Preventative protocols reviewed and updated unless pt declined. Discussed healthy diet and lifestyle.  

## 2014-07-03 NOTE — Assessment & Plan Note (Signed)
Chronic, asxs. Continue medical treatment.

## 2014-07-03 NOTE — Progress Notes (Signed)
Pre visit review using our clinic review tool, if applicable. No additional management support is needed unless otherwise documented below in the visit note. 

## 2014-07-03 NOTE — Assessment & Plan Note (Signed)
Chronic, stable. Continue regimen. 

## 2014-07-03 NOTE — Assessment & Plan Note (Signed)
Advanced directive: has at home. HCPOA is wife. Will bring me copy.

## 2014-07-03 NOTE — Assessment & Plan Note (Signed)
Chronic, stable. Continue lipitor 80mg  daily - will check with cardiology re decreased dose

## 2014-07-03 NOTE — Progress Notes (Signed)
BP 136/76 mmHg  Pulse 60  Temp(Src) 98.1 F (36.7 C) (Oral)  Ht _0  (1.778 m)  Wt 230 lb 8 oz (104.554 kg)  BMI 33.07 kg/m2   CC: CPE  Subjective:    Patient ID: Leonard Hughes, male    DOB: July 06, 1945, 69 y.o.   MRN: [id number]  HPI: Leonard Hughes is a 69 y.o. male presenting on 07/03/2014 for Annual Exam   HLD - thinks lipitor 42m causing foot/toe cramping and numbness. crestor previously caused cramping. coq10 was not helpful for this.  Ongoing issues with L ear/TMJ fullness. Saw ENT, dentist, and endodontist with unrevealing workup. Comes and goes. Happened last year, then recurring over last 2-3 months. No fevers/chills, nausea, redness or swelling currently.   Walks every morning.  CAD s/p 1v CABG 2007. H/o stent in past. Dr. HKoleen Nimrod Followed by Dr. GRockey Situ No chest pain, tightness, SOB.   Preventative: Colon cancer screening - would like stool kit. Has done in past but I have not received results of last few. Advised to bring here to turn in. No blood in stool, no fmhx colon cancer.  Prostate cancer screening - PSA in past normal. Would like to continue screening.  Flu shot - today.  Tetanus - Possibly last 2007. Will defer Tdap.  Pneumovax - 06/2013. prevnar today Shingles shot - interested - will check with insurance.  Advanced directive: has at home. HCPOA is wife. Will bring me copy.  Caffeine: 2-3 cups coffee/day  Lives with wife, mother in law, cats and dog. grown daughter  Occupation: sPress photographer aPassenger transport manager Edu: BA at [employer] Activity: walking 2 mi 5d/wk  Diet: good water, fruits/vegetables daily   Relevant past medical, surgical, family and social history reviewed and updated as indicated. Interim medical history since our last visit reviewed. Allergies and medications reviewed and updated. Current Outpatient Prescriptions on File Prior to Visit  Medication Sig  . aspirin 81 MG EC tablet Take 81 mg by mouth daily.    .Marland Kitchenatorvastatin (LIPITOR) 80  MG tablet Take 1 tablet (80 mg total) by mouth daily.  . clopidogrel (PLAVIX) 75 MG tablet Take 1 tablet by mouth  daily  . fish oil-omega-3 fatty acids 1000 MG capsule Take 2 capsules (2 g total) by mouth daily.  . metoprolol succinate (TOPROL-XL) 50 MG 24 hr tablet Take 1 tablet by mouth  daily  . Multiple Vitamin (MULTIVITAMIN) tablet Take 1 tablet by mouth daily.     No current facility-administered medications on file prior to visit.    Review of Systems  Constitutional: Negative for fever, chills, activity change, appetite change, fatigue and unexpected weight change.  HENT: Negative for hearing loss.   Eyes: Negative for visual disturbance.  Respiratory: Negative for cough, chest tightness, shortness of breath and wheezing.   Cardiovascular: Negative for chest pain, palpitations and leg swelling.  Gastrointestinal: Negative for nausea, vomiting, abdominal pain, diarrhea, constipation, blood in stool and abdominal distention.  Genitourinary: Negative for hematuria and difficulty urinating.  Musculoskeletal: Negative for myalgias, arthralgias and neck pain.  Skin: Negative for rash.  Neurological: Negative for dizziness, seizures, syncope and headaches.  Hematological: Negative for adenopathy. Bruises/bleeds easily.  Psychiatric/Behavioral: Negative for dysphoric mood. The patient is not nervous/anxious.    Per HPI unless specifically indicated above     Objective:    BP 136/76 mmHg  Pulse 60  Temp(Src) 98.1 F (36.7 C) (Oral)  Ht _1  (1.778 m)  Wt 230 lb 8 oz (  104.554 kg)  BMI 33.07 kg/m2  Wt Readings from Last 3 Encounters:  07/03/14 230 lb 8 oz (104.554 kg)  06/28/13 224 lb 8 oz (101.833 kg)  04/28/13 219 lb 8 oz (99.565 kg)    Physical Exam  Constitutional: He is oriented to person, place, and time. He appears well-developed and well-nourished. No distress.  HENT:  Head: Normocephalic and atraumatic.  Right Ear: Hearing, tympanic membrane, external ear and ear  canal normal.  Left Ear: Hearing, tympanic membrane, external ear and ear canal normal.  Nose: Nose normal.  Mouth/Throat: Uvula is midline, oropharynx is clear and moist and mucous membranes are normal. No oropharyngeal exudate, posterior oropharyngeal edema or posterior oropharyngeal erythema.  Decreased ROM L TMJ compared to R. No temporal pain, no cervical LAD  Eyes: Conjunctivae and EOM are normal. Pupils are equal, round, and reactive to light. No scleral icterus.  Neck: Normal range of motion. Neck supple. Carotid bruit is not present. No thyromegaly present.  Cardiovascular: Normal rate, regular rhythm, normal heart sounds and intact distal pulses.   No murmur heard. Pulses:      Radial pulses are 2+ on the right side, and 2+ on the left side.  Pulmonary/Chest: Effort normal and breath sounds normal. No respiratory distress. He has no wheezes. He has no rales.  Abdominal: Soft. Bowel sounds are normal. He exhibits no distension and no mass. There is no tenderness. There is no rebound and no guarding.  Genitourinary: Rectum normal and prostate normal. Rectal exam shows no external hemorrhoid, no internal hemorrhoid, no fissure, no mass, no tenderness and anal tone normal. Prostate is not enlarged (20gm) and not tender.  Slight swelling R lobe  Musculoskeletal: Normal range of motion. He exhibits no edema.  Lymphadenopathy:    He has no cervical adenopathy.  Neurological: He is alert and oriented to person, place, and time.  CN grossly intact, station and gait intact  Skin: Skin is warm and dry. No rash noted.  Psychiatric: He has a normal mood and affect. His behavior is normal. Judgment and thought content normal.  Nursing note and vitals reviewed.  Results for orders placed or performed in visit on 06/26/14  Lipid panel  Result Value Ref Range   Cholesterol 120 0 - 200 mg/dL   Triglycerides 153.0 (H) 0.0 - 149.0 mg/dL   HDL 34.80 (L) >39.00 mg/dL   VLDL 30.6 0.0 - 40.0 mg/dL     LDL Cholesterol 55 0 - 99 mg/dL   Total CHOL/HDL Ratio 3    NonHDL 85.20   Comprehensive metabolic panel  Result Value Ref Range   Sodium 140 135 - 145 mEq/L   Potassium 4.8 3.5 - 5.1 mEq/L   Chloride 105 96 - 112 mEq/L   CO2 29 19 - 32 mEq/L   Glucose, Bld 102 (H) 70 - 99 mg/dL   BUN 12 6 - 23 mg/dL   Creatinine, Ser 0.96 0.40 - 1.50 mg/dL   Total Bilirubin 0.5 0.2 - 1.2 mg/dL   Alkaline Phosphatase 63 39 - 117 U/L   AST 24 0 - 37 U/L   ALT 35 0 - 53 U/L   Total Protein 6.6 6.0 - 8.3 g/dL   Albumin 4.2 3.5 - 5.2 g/dL   Calcium 8.9 8.4 - 10.5 mg/dL   GFR 83.20 >60.00 mL/min  PSA, Medicare  Result Value Ref Range   PSA 1.78 0.10 - 4.00 ng/ml      Assessment & Plan:   Problem List Items Addressed  This Visit    TMJ pain dysfunction syndrome    Anticipate TMJ dysfunction - treat with flexeril and discussed eccentric exercises to try. He has already seen dentist and ENT. Update if no better, consider PT vs referral back to dentistry.      HYPERTENSION, BENIGN    Chronic, stable. Continue regimen.      HLD (hyperlipidemia)    Chronic, stable. Continue lipitor 87m daily - will check with cardiology re decreased dose      Healthcare maintenance - Primary    Preventative protocols reviewed and updated unless pt declined. Discussed healthy diet and lifestyle.       CAD, ARTERY BYPASS GRAFT    Chronic, asxs. Continue medical treatment.      Advanced care planning/counseling discussion    Advanced directive: has at home. HCPOA is wife. Will bring me copy.       Other Visit Diagnoses    Need for influenza vaccination        Relevant Orders    Flu Vaccine QUAD 36+ mos PF IM (Fluarix Quad PF) (Completed)    Special screening for malignant neoplasms, colon        Relevant Orders    Fecal occult blood, imunochemical        Follow up plan: Return in about 1 year (around 07/04/2015), or as needed, for annual exam, prior fasting for blood work.

## 2014-07-07 ENCOUNTER — Telehealth: Payer: Self-pay | Admitting: Family Medicine

## 2014-07-07 NOTE — Telephone Encounter (Signed)
emmi emailed °

## 2014-09-15 ENCOUNTER — Ambulatory Visit (INDEPENDENT_AMBULATORY_CARE_PROVIDER_SITE_OTHER): Payer: 59 | Admitting: Cardiovascular Disease

## 2014-09-15 ENCOUNTER — Encounter: Payer: Self-pay | Admitting: Cardiovascular Disease

## 2014-09-15 VITALS — BP 132/80 | HR 64 | Ht 71.0 in | Wt 228.5 lb

## 2014-09-15 DIAGNOSIS — E785 Hyperlipidemia, unspecified: Secondary | ICD-10-CM

## 2014-09-15 DIAGNOSIS — I10 Essential (primary) hypertension: Secondary | ICD-10-CM | POA: Diagnosis not present

## 2014-09-15 DIAGNOSIS — I25810 Atherosclerosis of coronary artery bypass graft(s) without angina pectoris: Secondary | ICD-10-CM

## 2014-09-15 MED ORDER — EZETIMIBE 10 MG PO TABS: 10.0000 mg | ORAL_TABLET | Freq: Every day | ORAL | Status: DC

## 2014-09-15 NOTE — Assessment & Plan Note (Signed)
Blood pressure is well controlled on today's visit. No changes made to the medications. 

## 2014-09-15 NOTE — Progress Notes (Signed)
Patient ID: Leonard Hughes, male    DOB: Oct 08, 1945, 69 y.o.   MRN: [id number]  HPI Comments: Leonard Hughes is a very pleasant 69 year old gentleman with past medical history of coronary artery disease, severe proximal LAD disease in 2001 with stent placed at that time, stress test showing ischemia in 2007 leading to catheterization and eventual bypass surgery by Dr. Dorris Fetch in March of 2007  for 75% left main disease, history of obesity, and strong family history of coronary artery disease who presents for follow up of his coronary artery disease  In follow-up today, he reports that he is been feeling well. He continues to work full-time Denies having any significant chest pain or shortness of breath He does have significant cramping in his feet, worse at nighttime  EKG on today's visit shows normal sinus rhythm with rate 64 beats per minute with no significant ST or T wave changes  Other past medical history  stress tests have come back positive both in 2001 and in  2007. 2001-lead to a stent in 2007, his bypass surgery.       Allergies  Allergen Reactions  . Crestor [Rosuvastatin] Other (See Comments)    myalgias    Outpatient Encounter Prescriptions as of 09/15/2014  Medication Sig  . aspirin 81 MG EC tablet Take 81 mg by mouth daily.    Marland Kitchen atorvastatin (LIPITOR) 80 MG tablet Take 1 tablet (80 mg total) by mouth daily.  . clopidogrel (PLAVIX) 75 MG tablet Take 1 tablet by mouth  daily  . metoprolol succinate (TOPROL-XL) 50 MG 24 hr tablet Take 1 tablet by mouth  daily  . Multiple Vitamin (MULTIVITAMIN) tablet Take 1 tablet by mouth daily.    . Omega-3 Fatty Acids (OMEGA-3 FISH OIL) 1200 MG CAPS Take 2,400 mg by mouth daily.  Marland Kitchen ezetimibe (ZETIA) 10 MG tablet Take 1 tablet (10 mg total) by mouth daily.  . [DISCONTINUED] cyclobenzaprine (FLEXERIL) 10 MG tablet Take 0.5-1 tablets (5-10 mg total) by mouth 3 (three) times daily as needed for muscle spasms. (Patient not taking: Reported on  09/15/2014)  . [DISCONTINUED] fish oil-omega-3 fatty acids 1000 MG capsule Take 2 capsules (2 g total) by mouth daily. (Patient not taking: Reported on 09/15/2014)    Past Medical History  Diagnosis Date  . Hyperlipidemia   . Hypertension   . History of chicken pox   . Coronary artery disease 2007    CABG 2v Dorris Fetch)    Past Surgical History  Procedure Laterality Date  . Percutaneous coronary stent intervention (pci-s)  2001    mid L circ  . Coronary artery bypass graft  2007    2 vessel, [facility]  . Tonsillectomy    . Coronary angioplasty      Social History  reports that he quit smoking about 35 years ago. His smoking use included Cigarettes. He has a 3 pack-year smoking history. He has never used smokeless tobacco. He reports that he drinks about 0.5 oz of alcohol per week. He reports that he does not use illicit drugs.  Family History family history includes CAD in his sister; CAD (age of onset: 61) in his brother; CAD (age of onset: 32) in his father; Heart disease in his mother; Stroke in his mother. There is no history of Diabetes or Cancer.   Review of Systems  Constitutional: Negative.   Respiratory: Negative.   Cardiovascular: Negative.   Gastrointestinal: Negative.   Musculoskeletal: Positive for myalgias.  Skin: Negative.   Neurological: Negative.  Foot numbness  Psychiatric/Behavioral: Negative.   All other systems reviewed and are negative.   BP 132/80 mmHg  Pulse 64  Ht 5\' 11"  (1.803 m)  Wt 228 lb 8 oz (103.647 kg)  BMI 31.88 kg/m2  Physical Exam  Constitutional: He is oriented to person, place, and time. He appears well-developed and well-nourished.  HENT:  Head: Normocephalic.  Nose: Nose normal.  Mouth/Throat: Oropharynx is clear and moist.  Eyes: Conjunctivae are normal. Pupils are equal, round, and reactive to light.  Neck: Normal range of motion. Neck supple. No JVD present.  Cardiovascular: Normal rate, regular rhythm, S1  normal, S2 normal, normal heart sounds and intact distal pulses.  Exam reveals no gallop and no friction rub.   No murmur heard. Pulmonary/Chest: Effort normal and breath sounds normal. No respiratory distress. He has no wheezes. He has no rales. He exhibits no tenderness.  Abdominal: Soft. Bowel sounds are normal. He exhibits no distension. There is no tenderness.  Musculoskeletal: Normal range of motion. He exhibits no edema or tenderness.  Lymphadenopathy:    He has no cervical adenopathy.  Neurological: He is alert and oriented to person, place, and time. Coordination normal.  Skin: Skin is warm and dry. No rash noted. No erythema.  Psychiatric: He has a normal mood and affect. His behavior is normal. Judgment and thought content normal.      Assessment and Plan   Nursing note and vitals reviewed.

## 2014-09-15 NOTE — Assessment & Plan Note (Signed)
He has significant cramping. We have recommended he decrease the Lipitor down to 40 mg daily and start zetia 10 mg daily Coupon and samples provided

## 2014-09-15 NOTE — Patient Instructions (Addendum)
You are doing well.  Start zetia one a day, Cut the lipitor in 1/2 daily (40 mg daily)  Check cholesterol and liver in 6 months  Please call us if you have new issues that need to be addressed before your next appt.  Your physician wants you to follow-up in: 6 months.  You will receive a reminder letter in the mail two months in advance. If you don't receive a letter, please call our office to schedule the follow-up appointment.

## 2014-09-15 NOTE — Assessment & Plan Note (Signed)
Currently with no symptoms of angina. No further workup at this time. Continue current medication regimen. 

## 2014-09-16 ENCOUNTER — Other Ambulatory Visit: Payer: Self-pay | Admitting: Cardiovascular Disease

## 2015-06-11 ENCOUNTER — Other Ambulatory Visit (INDEPENDENT_AMBULATORY_CARE_PROVIDER_SITE_OTHER): Payer: 59 | Admitting: *Deleted

## 2015-06-11 DIAGNOSIS — I25810 Atherosclerosis of coronary artery bypass graft(s) without angina pectoris: Secondary | ICD-10-CM | POA: Diagnosis not present

## 2015-06-12 LAB — LIPID PANEL
CHOL/HDL RATIO: 4.6 ratio (ref 0.0–5.0)
Cholesterol, Total: 123 mg/dL (ref 100–199)
HDL: 27 mg/dL — AB (ref >39)
LDL Calculated: 59 mg/dL (ref 0–99)
Triglycerides: 184 mg/dL — ABNORMAL HIGH (ref 0–149)
VLDL Cholesterol Cal: 37 mg/dL (ref 5–40)

## 2015-06-12 LAB — HEPATIC FUNCTION PANEL
ALBUMIN: 4.7 g/dL (ref 3.6–4.8)
ALK PHOS: 58 IU/L (ref 39–117)
ALT: 33 IU/L (ref 0–44)
AST: 25 IU/L (ref 0–40)
Bilirubin Total: 0.4 mg/dL (ref 0.0–1.2)
Bilirubin, Direct: 0.13 mg/dL (ref 0.00–0.40)
Total Protein: 7.4 g/dL (ref 6.0–8.5)

## 2015-06-18 ENCOUNTER — Ambulatory Visit (INDEPENDENT_AMBULATORY_CARE_PROVIDER_SITE_OTHER): Payer: 59 | Admitting: Cardiovascular Disease

## 2015-06-18 ENCOUNTER — Encounter: Payer: Self-pay | Admitting: Cardiovascular Disease

## 2015-06-18 VITALS — BP 130/70 | HR 56 | Ht 71.0 in | Wt 227.8 lb

## 2015-06-18 DIAGNOSIS — R5383 Other fatigue: Secondary | ICD-10-CM | POA: Diagnosis not present

## 2015-06-18 DIAGNOSIS — I25810 Atherosclerosis of coronary artery bypass graft(s) without angina pectoris: Secondary | ICD-10-CM | POA: Diagnosis not present

## 2015-06-18 DIAGNOSIS — E785 Hyperlipidemia, unspecified: Secondary | ICD-10-CM | POA: Diagnosis not present

## 2015-06-18 DIAGNOSIS — R252 Cramp and spasm: Secondary | ICD-10-CM

## 2015-06-18 DIAGNOSIS — I10 Essential (primary) hypertension: Secondary | ICD-10-CM | POA: Diagnosis not present

## 2015-06-18 MED ORDER — METOPROLOL SUCCINATE ER 25 MG PO TB24: 25.0000 mg | ORAL_TABLET | Freq: Every day | ORAL | Status: DC

## 2015-06-18 MED ORDER — ATORVASTATIN CALCIUM 20 MG PO TABS: 20.0000 mg | ORAL_TABLET | Freq: Every day | ORAL | Status: DC

## 2015-06-18 NOTE — Assessment & Plan Note (Signed)
Cholesterol at goal but he is having symptoms of myalgias possibly from the Lipitor Recommended he decrease Lipitor down to 20 mg daily, would stay on zetia 10 mg daily We did discuss a statin holiday. He reports it does not help with his myalgias Reports having problems on Crestor. Took this for 2 days and had myalgias

## 2015-06-18 NOTE — Assessment & Plan Note (Signed)
Blood pressure is well controlled on today's visit. No changes made to the medications. 

## 2015-06-18 NOTE — Assessment & Plan Note (Signed)
Etiology of his muscle cramping is unclear as he does not feel is from his statin Less likely claudication symptoms as this occurs at rest We'll decrease Lipitor, increase coenzyme Q 10 Encouraged light exercise, stretching

## 2015-06-18 NOTE — Patient Instructions (Addendum)
You are doing well.  Please cut the metoprolol in 1/2 daily If you start getting palpitations on the low dose, Go back to the high dose  Please decrease the lipitor down to 20 mg daily with zetia daily  Talk with Dr. Reece Agar, If he is concerned about leg blockes, call the office, we could scan the legs for blockages  Please call us if you have new issues that need to be addressed before your next appt.  Your physician wants you to follow-up in: 6 months.  You will receive a reminder letter in the mail two months in advance. If you don't receive a letter, please call our office to schedule the follow-up appointment.

## 2015-06-18 NOTE — Assessment & Plan Note (Signed)
Currently with no symptoms of angina. No further workup at this time. Continue current medication regimen. 

## 2015-06-18 NOTE — Progress Notes (Signed)
Patient ID: Leonard Hughes, male    DOB: 23-Jun-1945, 70 y.o.   MRN: [id number]  HPI Comments: Mr. Kantner is a very pleasant 70 year old gentleman with past medical history of coronary artery disease, severe proximal LAD disease in 2001 with stent placed at that time, stress test showing ischemia in 2007 leading to catheterization and eventual bypass surgery by Dr. Dorris Fetch in March of 2007  for 75% left main disease, history of obesity, and strong family history of coronary artery disease who presents for follow up of his coronary artery disease  In follow-up, he reports having severe leg cramping Does not feel it is from his Lipitor, symptoms persisted despite decreasing from 80 mg to 40 mg He stopped the zetia on the assumption this was causing his leg cramping Very busy at work, no regular exercise program, use to walk 2 miles per day. Limited by leg symptoms If he stretches or curls his toes, they go into spasm Also reports they are numb  Lab work reviewed with him showing total cholesterol 123 Feels fatigue, he wonder if his medications need to be adjusted  EKG on today's visit shows normal sinus rhythm with rate 56 beats per minute with no significant ST or T wave changes  Other past medical history  stress tests have come back positive both in 2001 and in  2007. 2001-lead to a stent in 2007, his bypass surgery.       Allergies  Allergen Reactions  . Crestor [Rosuvastatin] Other (See Comments)    myalgias    Outpatient Encounter Prescriptions as of 06/18/2015  Medication Sig  . aspirin 81 MG EC tablet Take 81 mg by mouth daily.    . Calcium-Magnesium-Vitamin D (CALCIUM MAGNESIUM PO) Take 4 tablets day.  . clopidogrel (PLAVIX) 75 MG tablet Take 1 tablet by mouth  daily  . Coenzyme Q10 (CO Q-10) 100 MG CAPS Take by mouth.  . metoprolol succinate (TOPROL-XL) 25 MG 24 hr tablet Take 1 tablet (25 mg total) by mouth daily. Take with or immediately following a meal.  . Multiple  Vitamin (MULTIVITAMIN) tablet Take 1 tablet by mouth daily.    . Multiple Vitamins-Minerals (ANTIOXIDANT PO) Take by mouth daily.  . Omega-3 Fatty Acids (OMEGA-3 FISH OIL) 1200 MG CAPS Take 2,400 mg by mouth daily.  . [DISCONTINUED] atorvastatin (LIPITOR) 40 MG tablet Take 40 mg by mouth daily.  . [DISCONTINUED] metoprolol succinate (TOPROL-XL) 50 MG 24 hr tablet Take 1 tablet by mouth  daily  . atorvastatin (LIPITOR) 20 MG tablet Take 1 tablet (20 mg total) by mouth daily.  Marland Kitchen ezetimibe (ZETIA) 10 MG tablet Take 1 tablet (10 mg total) by mouth daily.  . [DISCONTINUED] atorvastatin (LIPITOR) 80 MG tablet Take 1 tablet by mouth  daily (Patient not taking: Reported on 06/18/2015)  . [DISCONTINUED] ezetimibe (ZETIA) 10 MG tablet Take 1 tablet (10 mg total) by mouth daily. (Patient not taking: Reported on 06/18/2015)   No facility-administered encounter medications on file as of 06/18/2015.    Past Medical History  Diagnosis Date  . Hyperlipidemia   . Hypertension   . History of chicken pox   . Coronary artery disease 2007    CABG 2v Dorris Fetch)    Past Surgical History  Procedure Laterality Date  . Percutaneous coronary stent intervention (pci-s)  2001    mid L circ  . Coronary artery bypass graft  2007    2 vessel, [facility]  . Tonsillectomy    . Coronary angioplasty  Social History  reports that he quit smoking about 35 years ago. His smoking use included Cigarettes. He has a 3 pack-year smoking history. He has never used smokeless tobacco. He reports that he drinks about 0.5 oz of alcohol per week. He reports that he does not use illicit drugs.  Family History family history includes CAD in his sister; CAD (age of onset: 50) in his brother; CAD (age of onset: 41) in his father; Heart disease in his mother; Stroke in his mother. There is no history of Diabetes or Cancer.   Review of Systems  Constitutional: Negative.   Respiratory: Negative.   Cardiovascular: Negative.    Gastrointestinal: Negative.   Musculoskeletal: Positive for myalgias.  Skin: Negative.   Neurological: Negative.        Foot numbness  Psychiatric/Behavioral: Negative.   All other systems reviewed and are negative.   BP 130/70 mmHg  Pulse 56  Ht  (1.803 m)  Wt 227 lb 12 oz (103.307 kg)  BMI 31.78 kg/m2  Physical Exam  Constitutional: He is oriented to person, place, and time. He appears well-developed and well-nourished.  HENT:  Head: Normocephalic.  Nose: Nose normal.  Mouth/Throat: Oropharynx is clear and moist.  Eyes: Conjunctivae are normal. Pupils are equal, round, and reactive to light.  Neck: Normal range of motion. Neck supple. No JVD present.  Cardiovascular: Normal rate, regular rhythm, S1 normal, S2 normal, normal heart sounds and intact distal pulses.  Exam reveals no gallop and no friction rub.   No murmur heard. Pulmonary/Chest: Effort normal and breath sounds normal. No respiratory distress. He has no wheezes. He has no rales. He exhibits no tenderness.  Abdominal: Soft. Bowel sounds are normal. He exhibits no distension. There is no tenderness.  Musculoskeletal: Normal range of motion. He exhibits no edema or tenderness.  Lymphadenopathy:    He has no cervical adenopathy.  Neurological: He is alert and oriented to person, place, and time. Coordination normal.  Skin: Skin is warm and dry. No rash noted. No erythema.  Psychiatric: He has a normal mood and affect. His behavior is normal. Judgment and thought content normal.      Assessment and Plan   Nursing note and vitals reviewed.

## 2015-06-21 ENCOUNTER — Other Ambulatory Visit: Payer: Self-pay | Admitting: Cardiovascular Disease

## 2015-07-04 ENCOUNTER — Other Ambulatory Visit: Payer: Self-pay | Admitting: Family Medicine

## 2015-07-04 ENCOUNTER — Other Ambulatory Visit: Payer: 59

## 2015-07-04 DIAGNOSIS — I10 Essential (primary) hypertension: Secondary | ICD-10-CM

## 2015-07-04 DIAGNOSIS — Z125 Encounter for screening for malignant neoplasm of prostate: Secondary | ICD-10-CM

## 2015-07-04 DIAGNOSIS — E785 Hyperlipidemia, unspecified: Secondary | ICD-10-CM

## 2015-07-04 DIAGNOSIS — Z1159 Encounter for screening for other viral diseases: Secondary | ICD-10-CM

## 2015-07-06 ENCOUNTER — Encounter: Payer: 59 | Admitting: Family Medicine

## 2015-09-19 ENCOUNTER — Other Ambulatory Visit: Payer: Self-pay | Admitting: Family Medicine

## 2015-09-19 ENCOUNTER — Other Ambulatory Visit (INDEPENDENT_AMBULATORY_CARE_PROVIDER_SITE_OTHER): Payer: 59

## 2015-09-19 DIAGNOSIS — Z1159 Encounter for screening for other viral diseases: Secondary | ICD-10-CM

## 2015-09-19 DIAGNOSIS — I10 Essential (primary) hypertension: Secondary | ICD-10-CM | POA: Diagnosis not present

## 2015-09-19 DIAGNOSIS — Z125 Encounter for screening for malignant neoplasm of prostate: Secondary | ICD-10-CM

## 2015-09-19 LAB — BASIC METABOLIC PANEL
BUN: 13 mg/dL (ref 6–23)
CALCIUM: 9.8 mg/dL (ref 8.4–10.5)
CHLORIDE: 103 meq/L (ref 96–112)
CO2: 33 mEq/L — ABNORMAL HIGH (ref 19–32)
CREATININE: 1.13 mg/dL (ref 0.40–1.50)
GFR: 68.67 mL/min (ref >60.00)
Glucose, Bld: 113 mg/dL — ABNORMAL HIGH (ref 70–99)
Potassium: 5.4 mEq/L — ABNORMAL HIGH (ref 3.5–5.1)
Sodium: 142 mEq/L (ref 135–145)

## 2015-09-19 LAB — PSA, MEDICARE: PSA: 1.54 ng/mL (ref 0.10–4.00)

## 2015-09-20 LAB — HEPATITIS C ANTIBODY: HCV Ab: NEGATIVE

## 2015-09-24 ENCOUNTER — Encounter: Payer: Self-pay | Admitting: Family Medicine

## 2015-09-24 ENCOUNTER — Ambulatory Visit (INDEPENDENT_AMBULATORY_CARE_PROVIDER_SITE_OTHER): Payer: 59 | Admitting: Family Medicine

## 2015-09-24 VITALS — BP 138/74 | HR 76 | Temp 98.2°F | Ht 70.0 in | Wt 217.2 lb

## 2015-09-24 DIAGNOSIS — I10 Essential (primary) hypertension: Secondary | ICD-10-CM

## 2015-09-24 DIAGNOSIS — Z23 Encounter for immunization: Secondary | ICD-10-CM | POA: Diagnosis not present

## 2015-09-24 DIAGNOSIS — Z0000 Encounter for general adult medical examination without abnormal findings: Secondary | ICD-10-CM | POA: Diagnosis not present

## 2015-09-24 DIAGNOSIS — E875 Hyperkalemia: Secondary | ICD-10-CM | POA: Diagnosis not present

## 2015-09-24 DIAGNOSIS — R7303 Prediabetes: Secondary | ICD-10-CM | POA: Diagnosis not present

## 2015-09-24 DIAGNOSIS — R202 Paresthesia of skin: Secondary | ICD-10-CM | POA: Diagnosis not present

## 2015-09-24 DIAGNOSIS — Z7189 Other specified counseling: Secondary | ICD-10-CM | POA: Diagnosis not present

## 2015-09-24 DIAGNOSIS — R252 Cramp and spasm: Secondary | ICD-10-CM

## 2015-09-24 DIAGNOSIS — Z1211 Encounter for screening for malignant neoplasm of colon: Secondary | ICD-10-CM | POA: Diagnosis not present

## 2015-09-24 DIAGNOSIS — I25810 Atherosclerosis of coronary artery bypass graft(s) without angina pectoris: Secondary | ICD-10-CM

## 2015-09-24 LAB — POTASSIUM: POTASSIUM: 5 meq/L (ref 3.5–5.1)

## 2015-09-24 LAB — TSH: TSH: 0.89 u[IU]/mL (ref 0.35–4.50)

## 2015-09-24 LAB — FOLATE: Folate: 21.7 ng/mL (ref >5.9)

## 2015-09-24 LAB — HEMOGLOBIN A1C: Hgb A1c MFr Bld: 6 % (ref 4.6–6.5)

## 2015-09-24 LAB — VITAMIN B12: Vitamin B-12: 248 pg/mL (ref 211–911)

## 2015-09-24 NOTE — Assessment & Plan Note (Signed)
Advanced directive: has at home. HCPOA is wife. Will bring me copy.

## 2015-09-24 NOTE — Addendum Note (Signed)
Addended by: Eustaquio BoydenGUTIERREZ, Rynell Ciotti on: 09/24/2015 12:10 PM   Modules accepted: Kipp BroodSmartSet

## 2015-09-24 NOTE — Addendum Note (Signed)
Addended by: Josph MachoANCE, Rilla Buckman A on: 09/24/2015 12:49 PM   Modules accepted: Orders

## 2015-09-24 NOTE — Assessment & Plan Note (Signed)
Preventative protocols reviewed and updated unless pt declined. Discussed healthy diet and lifestyle.  

## 2015-09-24 NOTE — Progress Notes (Signed)
BP 138/74 mmHg  Pulse 76  Temp(Src) 98.2 F (36.8 C) (Oral)  Ht _0  (1.778 m)  Wt 217 lb 4 oz (98.544 kg)  BMI 31.17 kg/m2   CC: CPE  Subjective:    Patient ID: Leonard Hughes, male    DOB: 01/30/1946, 70 y.o.   MRN: [id number]  HPI: Leonard Hughes is a 70 y.o. male presenting on 09/24/2015 for Annual Exam   Saw Dr Rockey Situ 05/2015 - myalgias possibly from lipitor - decreased to 87m daily + continued zetia and coQ 10. This did not help. Ongoing night cramping and toe/foot paresthesias mainly at nigh time, no trouble during the day. Denies significant RLS symptoms.   CAD s/p 1v CABG 2007. H/o stent in past. Dr. HKoleen Nimrod Followed by Dr. GRockey Situ No chest pain, tightness, SOB.   10 lb weight loss since 05/2015 - has been working on [employer]  Preventative: Colon cancer screening - would like stool kit. Has done in past but I have not received results of last few. Advised to bring here to turn in. No blood in stool, no fmhx colon cancer.  Prostate cancer screening - PSA in past normal. Would like to continue screening.  Flu shot - today.  Tetanus - Possibly last 2007. Will defer Tdap.  Pneumovax - 06/2013. prevnar 2016.  Shingles shot - interested - will check with insurance.  Advanced directive: has at home. HCPOA is wife. Will bring me copy. Seat belt use discussed Sunscreen use discussed. No changing moles on skin. Sees dermatologist.   Caffeine: 2-3 cups coffee/day  Lives with wife, mother in law, cats and dog. grown daughter  Occupation: sPress photographer aPassenger transport manager Edu: BA at [employer] Activity: walking 2 mi 5d/wk  Diet: good water, fruits/vegetables daily   Relevant past medical, surgical, family and social history reviewed and updated as indicated. Interim medical history since our last visit reviewed. Allergies and medications reviewed and updated. Current Outpatient Prescriptions on File Prior to Visit  Medication Sig  . aspirin 81 MG EC tablet Take 81 mg by mouth daily.     .Marland Kitchenatorvastatin (LIPITOR) 20 MG tablet Take 1 tablet (20 mg total) by mouth daily.  . Calcium-Magnesium-Vitamin D (CALCIUM MAGNESIUM PO) Take 4 tablets day.  . clopidogrel (PLAVIX) 75 MG tablet Take 1 tablet by mouth  daily  . Coenzyme Q10 (CO Q-10) 100 MG CAPS Take by mouth.  . ezetimibe (ZETIA) 10 MG tablet Take 1 tablet (10 mg total) by mouth daily.  . metoprolol succinate (TOPROL-XL) 25 MG 24 hr tablet Take 1 tablet (25 mg total) by mouth daily. Take with or immediately following a meal.  . Multiple Vitamin (MULTIVITAMIN) tablet Take 1 tablet by mouth daily.    . Omega-3 Fatty Acids (OMEGA-3 FISH OIL) 1200 MG CAPS Take 2,400 mg by mouth daily.   No current facility-administered medications on file prior to visit.    Review of Systems  Constitutional: Negative for fever, chills, activity change, appetite change, fatigue and unexpected weight change.  HENT: Negative for hearing loss.   Eyes: Negative for visual disturbance.  Respiratory: Negative for cough, chest tightness, shortness of breath and wheezing.   Cardiovascular: Negative for chest pain, palpitations and leg swelling.  Gastrointestinal: Negative for nausea, vomiting, abdominal pain, diarrhea, constipation, blood in stool and abdominal distention.  Genitourinary: Negative for hematuria and difficulty urinating.  Musculoskeletal: Negative for myalgias, arthralgias and neck pain.  Skin: Negative for rash.  Neurological: Negative for dizziness, seizures, syncope and  headaches.  Hematological: Negative for adenopathy. Bruises/bleeds easily (with plavix).  Psychiatric/Behavioral: Negative for dysphoric mood. The patient is not nervous/anxious.    Per HPI unless specifically indicated in ROS section     Objective:    BP 138/74 mmHg  Pulse 76  Temp(Src) 98.2 F (36.8 C) (Oral)  Ht _0  (1.778 m)  Wt 217 lb 4 oz (98.544 kg)  BMI 31.17 kg/m2  Wt Readings from Last 3 Encounters:  09/24/15 217 lb 4 oz (98.544 kg)    06/18/15 227 lb 12 oz (103.307 kg)  09/15/14 228 lb 8 oz (103.647 kg)    Physical Exam  Constitutional: He is oriented to person, place, and time. He appears well-developed and well-nourished. No distress.  HENT:  Head: Normocephalic and atraumatic.  Right Ear: Hearing, tympanic membrane, external ear and ear canal normal.  Left Ear: Hearing, tympanic membrane, external ear and ear canal normal.  Nose: Nose normal.  Mouth/Throat: Uvula is midline, oropharynx is clear and moist and mucous membranes are normal. No oropharyngeal exudate, posterior oropharyngeal edema or posterior oropharyngeal erythema.  Eyes: Conjunctivae and EOM are normal. Pupils are equal, round, and reactive to light. No scleral icterus.  Neck: Normal range of motion. Neck supple. Carotid bruit is not present. No thyromegaly present.  Cardiovascular: Normal rate, regular rhythm, normal heart sounds and intact distal pulses.   No murmur heard. Pulses:      Radial pulses are 2+ on the right side, and 2+ on the left side.  Pulmonary/Chest: Effort normal and breath sounds normal. No respiratory distress. He has no wheezes. He has no rales.  Abdominal: Soft. Bowel sounds are normal. He exhibits no distension and no mass. There is no tenderness. There is no rebound and no guarding.  Genitourinary: Rectum normal and prostate normal. Rectal exam shows no external hemorrhoid, no internal hemorrhoid, no fissure, no mass, no tenderness and anal tone normal. Prostate is not enlarged (20gm) and not tender.  Musculoskeletal: Normal range of motion. He exhibits no edema.  Lymphadenopathy:    He has no cervical adenopathy.  Neurological: He is alert and oriented to person, place, and time.  CN grossly intact, station and gait intact  Skin: Skin is warm and dry. No rash noted.  Psychiatric: He has a normal mood and affect. His behavior is normal. Judgment and thought content normal.  Nursing note and vitals reviewed.  Results for  orders placed or performed in visit on 78/58/85  Basic metabolic panel  Result Value Ref Range   Sodium 142 135 - 145 mEq/L   Potassium 5.4 (H) 3.5 - 5.1 mEq/L   Chloride 103 96 - 112 mEq/L   CO2 33 (H) 19 - 32 mEq/L   Glucose, Bld 113 (H) 70 - 99 mg/dL   BUN 13 6 - 23 mg/dL   Creatinine, Ser 1.13 0.40 - 1.50 mg/dL   Calcium 9.8 8.4 - 10.5 mg/dL   GFR 68.67 >60.00 mL/min  PSA, Medicare  Result Value Ref Range   PSA 1.54 0.10 - 4.00 ng/ml      Assessment & Plan:   Problem List Items Addressed This Visit    HYPERTENSION, BENIGN    Chronic, stable. Continue current regimen.       CAD, ARTERY BYPASS GRAFT    Asxs. Continue aspirin, plavix, statin. Appreciate cards care of patient.      Healthcare maintenance - Primary    Preventative protocols reviewed and updated unless pt declined. Discussed healthy diet and lifestyle.  Paresthesia    Endorses some nocturnal leg cramping as well as some paresthesias of toes and balls of feet bilaterally. Check TSH, B12, folate today.  He had recently increased OTC potassium supplementation but has since stopped. Will repeat K today for noted hyperkalemia. sxs not consistent with RLS.      Relevant Orders   Vitamin B12   Folate   TSH   Advanced care planning/counseling discussion    Advanced directive: has at home. HCPOA is wife. Will bring me copy.      Muscle cramping    See above. Today describes more nocturnal cramps + paresthesias.       Prediabetes    Reviewed with patient recently elevated CBG's. Check A1c today.      Relevant Orders   Hemoglobin A1c    Other Visit Diagnoses    Special screening for malignant neoplasms, colon        Relevant Orders    Fecal occult blood, imunochemical    Hyperkalemia        Relevant Orders    Potassium        Follow up plan: Return in about 1 year (around 09/23/2016), or as needed, for annual exam, prior fasting for blood work.  Ria Bush, MD  CPE

## 2015-09-24 NOTE — Progress Notes (Signed)
Pre visit review using our clinic review tool, if applicable. No additional management support is needed unless otherwise documented below in the visit note. 

## 2015-09-24 NOTE — Assessment & Plan Note (Signed)
Chronic, stable. Continue current regimen. 

## 2015-09-24 NOTE — Patient Instructions (Addendum)
Repeat labs today. Pass by lab to pick up stool kit - then return here instead of mailing in.  Tdap today (tetanus and pertussis).  Shingles shot today.  Bring me copy of your living will to update your chart.  Watch diet - avoid added sugars and sweetened beverages to control sugar levels.   Health Maintenance, Male A healthy lifestyle and preventative care can promote health and wellness.  Maintain regular health, dental, and eye exams.  Eat a healthy diet. Foods like vegetables, fruits, whole grains, low-fat dairy products, and lean protein foods contain the nutrients you need and are low in calories. Decrease your intake of foods high in solid fats, added sugars, and salt. Get information about a proper diet from your health care provider, if necessary.  Regular physical exercise is one of the most important things you can do for your health. Most adults should get at least 150 minutes of moderate-intensity exercise (any activity that increases your heart rate and causes you to sweat) each week. In addition, most adults need muscle-strengthening exercises on 2 or more days a week.   Maintain a healthy weight. The body mass index (BMI) is a screening tool to identify possible weight problems. It provides an estimate of body fat based on height and weight. Your health care provider can find your BMI and can help you achieve or maintain a healthy weight. For males 20 years and older:  A BMI below 18.5 is considered underweight.  A BMI of 18.5 to 24.9 is normal.  A BMI of 25 to 29.9 is considered overweight.  A BMI of 30 and above is considered obese.  Maintain normal blood lipids and cholesterol by exercising and minimizing your intake of saturated fat. Eat a balanced diet with plenty of fruits and vegetables. Blood tests for lipids and cholesterol should begin at age 68 and be repeated every 5 years. If your lipid or cholesterol levels are high, you are over age 90, or you are at high  risk for heart disease, you may need your cholesterol levels checked more frequently.Ongoing high lipid and cholesterol levels should be treated with medicines if diet and exercise are not working.  If you smoke, find out from your health care provider how to quit. If you do not use tobacco, do not start.  Lung cancer screening is recommended for adults aged 6-80 years who are at high risk for developing lung cancer because of a history of smoking. A yearly low-dose CT scan of the lungs is recommended for people who have at least a 30-pack-year history of smoking and are current smokers or have quit within the past 15 years. A pack year of smoking is smoking an average of 1 pack of cigarettes a day for 1 year (for example, a 30-pack-year history of smoking could mean smoking 1 pack a day for 30 years or 2 packs a day for 15 years). Yearly screening should continue until the smoker has stopped smoking for at least 15 years. Yearly screening should be stopped for people who develop a health problem that would prevent them from having lung cancer treatment.  If you choose to drink alcohol, do not have more than 2 drinks per day. One drink is considered to be 12 oz (360 mL) of beer, 5 oz (150 mL) of wine, or 1.5 oz (45 mL) of liquor.  Avoid the use of street drugs. Do not share needles with anyone. Ask for help if you need support or instructions about  stopping the use of drugs.  High blood pressure causes heart disease and increases the risk of stroke. High blood pressure is more likely to develop in:  People who have blood pressure in the end of the normal range (100-139/85-89 mm Hg).  People who are overweight or obese.  People who are African American.  If you are 58-74 years of age, have your blood pressure checked every 3-5 years. If you are 12 years of age or older, have your blood pressure checked every year. You should have your blood pressure measured twice--once when you are at a hospital  or clinic, and once when you are not at a hospital or clinic. Record the average of the two measurements. To check your blood pressure when you are not at a hospital or clinic, you can use:  An automated blood pressure machine at a pharmacy.  A home blood pressure monitor.  If you are 55-57 years old, ask your health care provider if you should take aspirin to prevent heart disease.  Diabetes screening involves taking a blood sample to check your fasting blood sugar level. This should be done once every 3 years after age 71 if you are at a normal weight and without risk factors for diabetes. Testing should be considered at a younger age or be carried out more frequently if you are overweight and have at least 1 risk factor for diabetes.  Colorectal cancer can be detected and often prevented. Most routine colorectal cancer screening begins at the age of 78 and continues through age 31. However, your health care provider may recommend screening at an earlier age if you have risk factors for colon cancer. On a yearly basis, your health care provider may provide home test kits to check for hidden blood in the stool. A small camera at the end of a tube may be used to directly examine the colon (sigmoidoscopy or colonoscopy) to detect the earliest forms of colorectal cancer. Talk to your health care provider about this at age 42 when routine screening begins. A direct exam of the colon should be repeated every 5-10 years through age 21, unless early forms of precancerous polyps or small growths are found.  People who are at an increased risk for hepatitis B should be screened for this virus. You are considered at high risk for hepatitis B if:  You were born in a country where hepatitis B occurs often. Talk with your health care provider about which countries are considered high risk.  Your parents were born in a high-risk country and you have not received a shot to protect against hepatitis B (hepatitis B  vaccine).  You have HIV or AIDS.  You use needles to inject street drugs.  You live with, or have sex with, someone who has hepatitis B.  You are a man who has sex with other men (MSM).  You get hemodialysis treatment.  You take certain medicines for conditions like cancer, organ transplantation, and autoimmune conditions.  Hepatitis C blood testing is recommended for all people born from [birth year] through 1963 and any individual with known risk factors for hepatitis C.  Healthy men should no longer receive prostate-specific antigen (PSA) blood tests as part of routine cancer screening. Talk to your health care provider about prostate cancer screening.  Testicular cancer screening is not recommended for adolescents or adult males who have no symptoms. Screening includes self-exam, a health care provider exam, and other screening tests. Consult with your health care provider about  any symptoms you have or any concerns you have about testicular cancer.  Practice safe sex. Use condoms and avoid high-risk sexual practices to reduce the spread of sexually transmitted infections (STIs).  You should be screened for STIs, including gonorrhea and chlamydia if:  You are sexually active and are younger than 24 years.  You are older than 24 years, and your health care provider tells you that you are at risk for this type of infection.  Your sexual activity has changed since you were last screened, and you are at an increased risk for chlamydia or gonorrhea. Ask your health care provider if you are at risk.  If you are at risk of being infected with HIV, it is recommended that you take a prescription medicine daily to prevent HIV infection. This is called pre-exposure prophylaxis (PrEP). You are considered at risk if:  You are a man who has sex with other men (MSM).  You are a heterosexual man who is sexually active with multiple partners.  You take drugs by injection.  You are sexually active  with a partner who has HIV.  Talk with your health care provider about whether you are at high risk of being infected with HIV. If you choose to begin PrEP, you should first be tested for HIV. You should then be tested every 3 months for as long as you are taking PrEP.  Use sunscreen. Apply sunscreen liberally and repeatedly throughout the day. You should seek shade when your shadow is shorter than you. Protect yourself by wearing long sleeves, pants, a wide-brimmed hat, and sunglasses year round whenever you are outdoors.  Tell your health care provider of new moles or changes in moles, especially if there is a change in shape or color. Also, tell your health care provider if a mole is larger than the size of a pencil eraser.  A one-time screening for abdominal aortic aneurysm (AAA) and surgical repair of large AAAs by ultrasound is recommended for men aged 2-75 years who are current or former smokers.  Stay current with your vaccines (immunizations).   This information is not intended to replace advice given to you by your health care provider. Make sure you discuss any questions you have with your health care provider.   Document Released: 11/08/2007 Document Revised: 06/02/2014 Document Reviewed: 10/07/2010 Elsevier Interactive Patient Education [employer].

## 2015-09-24 NOTE — Assessment & Plan Note (Addendum)
Asxs. Continue aspirin, plavix, statin. Appreciate cards care of patient.

## 2015-09-24 NOTE — Assessment & Plan Note (Addendum)
Endorses some nocturnal leg cramping as well as some paresthesias of toes and balls of feet bilaterally. Check TSH, B12, folate today.  He had recently increased OTC potassium supplementation but has since stopped. Will repeat K today for noted hyperkalemia. sxs not consistent with RLS.

## 2015-09-24 NOTE — Assessment & Plan Note (Signed)
See above. Today describes more nocturnal cramps + paresthesias.

## 2015-09-24 NOTE — Assessment & Plan Note (Signed)
Reviewed with patient recently elevated CBG's. Check A1c today.

## 2015-09-25 ENCOUNTER — Other Ambulatory Visit: Payer: Self-pay | Admitting: Family Medicine

## 2015-09-25 ENCOUNTER — Encounter: Payer: Self-pay | Admitting: Family Medicine

## 2015-09-25 DIAGNOSIS — E538 Deficiency of other specified B group vitamins: Secondary | ICD-10-CM | POA: Insufficient documentation

## 2015-09-25 MED ORDER — CYANOCOBALAMIN 500 MCG PO TABS: 500.0000 ug | ORAL_TABLET | Freq: Every day | ORAL | Status: DC

## 2015-10-02 ENCOUNTER — Other Ambulatory Visit: Payer: 59

## 2015-10-02 DIAGNOSIS — Z1211 Encounter for screening for malignant neoplasm of colon: Secondary | ICD-10-CM

## 2015-10-02 LAB — FECAL OCCULT BLOOD, IMMUNOCHEMICAL: Fecal Occult Bld: NEGATIVE

## 2015-10-02 LAB — FECAL OCCULT BLOOD, GUAIAC: FECAL OCCULT BLD: NEGATIVE

## 2015-10-03 ENCOUNTER — Encounter: Payer: Self-pay | Admitting: *Deleted

## 2016-02-14 ENCOUNTER — Ambulatory Visit (INDEPENDENT_AMBULATORY_CARE_PROVIDER_SITE_OTHER): Payer: 59 | Admitting: Primary Care

## 2016-02-14 VITALS — BP 134/74 | HR 62 | Temp 97.8°F | Ht 70.0 in | Wt 221.8 lb

## 2016-02-14 DIAGNOSIS — R21 Rash and other nonspecific skin eruption: Secondary | ICD-10-CM | POA: Diagnosis not present

## 2016-02-14 MED ORDER — TRIAMCINOLONE ACETONIDE 0.025 % EX OINT: 1.0000 "application " | TOPICAL_OINTMENT | Freq: Two times a day (BID) | CUTANEOUS | 0 refills | Status: DC

## 2016-02-14 NOTE — Progress Notes (Signed)
Subjective:    Patient ID: Leonard Hughes, male    DOB: [birth year], 70 y.o.   MRN: [id number]  HPI  Leonard Hughes is a 70 year old male who presents today with a chief complaint of left eye lid discomfort. He describes his discomfort as itching which is located to the top of his left eye lid. This has been ongoing intermittently for the past several months. He's been applying neosporin, hydrocortisone cream, OTC eye ointment and drops without much improvement. He does report occasional rhinorrhea. Denies cough, fevers, cough. He underwent eye evaluation with optometrist 2-3 weeks ago and was told he had dry eyes, but otherwise an unremarkable eye exam.  Review of Systems  Constitutional: Negative for chills and fever.  HENT: Positive for rhinorrhea. Negative for congestion.   Eyes: Negative for discharge, redness, itching and visual disturbance.  Respiratory: Negative for cough.   Skin:       Itching and mild erythema to skin of left eye lid       Past Medical History:  Diagnosis Date  . Coronary artery disease 2007   CABG 2v Dorris Fetch([place])  . History of chicken pox   . Hyperlipidemia   . Hypertension      Social History   Social History  . Marital status: Married    Spouse name: N/A  . Number of children: N/A  . Years of education: N/A   Occupational History  . Not on file.   Social History Main Topics  . Smoking status: Former Smoker    Packs/day: 1.00    Years: 3.00    Types: Cigarettes    Quit date: 08/26/1979  . Smokeless tobacco: Never Used  . Alcohol use 0.5 oz/week    1 Standard drinks or equivalent per week     Comment: Socially  . Drug use: No  . Sexual activity: Not on file   Other Topics Concern  . Not on file   Social History Narrative   Caffeine: 2-3 cups coffee/day   Lives with wife, mother in law, cats and dog.  grown daughter   Occupation: Airline pilotsales, Scientist, water qualityaccount manager   Edu: BA at [employer]   Activity: walking 2 mi 5d/wk   Diet: good water,  fruits/vegetables daily    Past Surgical History:  Procedure Laterality Date  . CORONARY ANGIOPLASTY    . CORONARY ARTERY BYPASS GRAFT  2007   2 vessel, [place]  . PERCUTANEOUS CORONARY STENT INTERVENTION (PCI-S)  2001   mid L circ  . TONSILLECTOMY      Family History  Problem Relation Age of Onset  . Heart disease Mother     ICD and stents  . CAD Father 3248    MI  . CAD Sister     triple bypass  . CAD Brother 2744  . Stroke Mother     possibly  . Diabetes Neg Hx   . Cancer Neg Hx     Allergies  Allergen Reactions  . Crestor [Rosuvastatin] Other (See Comments)    myalgias    Current Outpatient Prescriptions on File Prior to Visit  Medication Sig Dispense Refill  . aspirin 81 MG EC tablet Take 81 mg by mouth daily.      Marland Kitchen. atorvastatin (LIPITOR) 20 MG tablet Take 1 tablet (20 mg total) by mouth daily. 90 tablet 3  . Calcium-Magnesium-Vitamin D (CALCIUM MAGNESIUM PO) Take 4 tablets day.    . clopidogrel (PLAVIX) 75 MG tablet Take 1 tablet by mouth  daily 90  tablet 3  . Coenzyme Q10 (CO Q-10) 100 MG CAPS Take by mouth.    . cyanocobalamin (V-R VITAMIN B-12) 500 MCG tablet Take 1 tablet (500 mcg total) by mouth daily.    Marland Kitchen ezetimibe (ZETIA) 10 MG tablet Take 1 tablet (10 mg total) by mouth daily. 90 tablet 3  . metoprolol succinate (TOPROL-XL) 25 MG 24 hr tablet Take 1 tablet (25 mg total) by mouth daily. Take with or immediately following a meal. 90 tablet 3  . Multiple Vitamin (MULTIVITAMIN) tablet Take 1 tablet by mouth daily.      . Omega-3 Fatty Acids (OMEGA-3 FISH OIL) 1200 MG CAPS Take 2,400 mg by mouth daily.     No current facility-administered medications on file prior to visit.     BP 134/74   Pulse 62   Temp 97.8 F (36.6 C) (Oral)   Ht 5\' 10"  (1.778 m)   Wt 221 lb 12.8 oz (100.6 kg)   SpO2 97%   BMI 31.82 kg/m    Objective:   Physical Exam  Constitutional: He appears well-nourished.  HENT:  Mouth/Throat: Oropharynx is clear and moist.  Eyes:  Right eye exhibits no discharge. Left eye exhibits no discharge. Right conjunctiva is not injected. Left conjunctiva is not injected.  Neck: Neck supple.  Cardiovascular: Normal rate and regular rhythm.   Pulmonary/Chest: Effort normal and breath sounds normal.  Skin: Skin is warm and dry.  Mild erythema/rash to left upper eye lid.           Assessment & Plan:  Eczema of Eye Lid:  Irritation to skin of left eye lid intermittently x 2-3 months. No actual eye discharge, erythema, itching. Normal eye eval from optometrist several weeks ago. Exam representative of mild eczema to lid of left eye. No infection. Will trial low potency triamcinolone cream and daily Zyrtec. He will call if no improvement.  Morrie Sheldon, NP

## 2016-02-14 NOTE — Progress Notes (Signed)
Pre visit review using our clinic review tool, if applicable. No additional management support is needed unless otherwise documented below in the visit note. 

## 2016-02-14 NOTE — Patient Instructions (Addendum)
Triamcinolone cream to the left eye lid twice daily for 1 week. Be very careful to avoid getting this into your eye.  Also start a daily antihistamine such as Zyrtec as bedtime.   These may both be purchased over the counter.  Please call me if no improvement in 1 week.  It was a pleasure meeting you!

## 2016-03-13 ENCOUNTER — Encounter: Payer: Self-pay | Admitting: Cardiovascular Disease

## 2016-03-13 ENCOUNTER — Ambulatory Visit (INDEPENDENT_AMBULATORY_CARE_PROVIDER_SITE_OTHER): Payer: 59 | Admitting: Cardiovascular Disease

## 2016-03-13 VITALS — BP 144/78 | HR 56 | Ht 71.0 in | Wt 222.8 lb

## 2016-03-13 DIAGNOSIS — R252 Cramp and spasm: Secondary | ICD-10-CM

## 2016-03-13 DIAGNOSIS — I10 Essential (primary) hypertension: Secondary | ICD-10-CM | POA: Diagnosis not present

## 2016-03-13 DIAGNOSIS — I25810 Atherosclerosis of coronary artery bypass graft(s) without angina pectoris: Secondary | ICD-10-CM | POA: Diagnosis not present

## 2016-03-13 DIAGNOSIS — Z23 Encounter for immunization: Secondary | ICD-10-CM

## 2016-03-13 DIAGNOSIS — E7800 Pure hypercholesterolemia, unspecified: Secondary | ICD-10-CM

## 2016-03-13 DIAGNOSIS — R202 Paresthesia of skin: Secondary | ICD-10-CM

## 2016-03-13 NOTE — Progress Notes (Signed)
Home in follow-up today Cardiology Office Note  Date:  03/13/2016   ID:  JALIEN WEAKLAND, DOB 08-30-1945, MRN [id number]  PCP:  Eustaquio Boyden, MD   Chief Complaint  Patient presents with  . other    6 month f/u c/o feet and toe pain. Meds reviewed verbally with pt.    HPI:  Mr. Salazar is a very pleasant 70 year old gentleman with past medical history of coronary artery disease, severe proximal LAD disease in 2001 with stent placed at that time, stress test showing ischemia in 2007 leading to catheterization and eventual bypass surgery by Dr. Dorris Fetch in March of 2007  for 75% left main disease, history of obesity, and strong family history of coronary artery disease who presents for follow up of his coronary artery disease  In follow-up today he reports that he is doing well  Weight down 5 pounds From prior office visit  Quit walking, knee problem, no regular exercise  Rare leg cramps Feet numb at night in bed, feeling cold  Lab work reviewed with him in detail  Labs 05/2015 Total chol 123, LDL 59  HBA1C 6.0 No recent labs  Tolerating Lipitor 20 ng daily, down from 80 mg when he was having myalgias Very busy at work, no regular exercise program, use to walk 2 miles per day. Limited by leg symptoms  EKG on today's visit shows normal sinus rhythm with rate 56 beats per minute with no significant ST or T wave changes  Other past medical history  stress tests have come back positive both in 2001 and in  2007. 2001-lead to a stent in 2007, his bypass surgery.    PMH:   has a past medical history of Coronary artery disease (2007); History of chicken pox; Hyperlipidemia; and Hypertension.  PSH:    Past Surgical History:  Procedure Laterality Date  . CORONARY ANGIOPLASTY    . CORONARY ARTERY BYPASS GRAFT  2007   2 vessel, [place]  . PERCUTANEOUS CORONARY STENT INTERVENTION (PCI-S)  2001   mid L circ  . TONSILLECTOMY      Current Outpatient Prescriptions   Medication Sig Dispense Refill  . aspirin 81 MG EC tablet Take 81 mg by mouth daily.      Marland Kitchen atorvastatin (LIPITOR) 20 MG tablet Take 1 tablet (20 mg total) by mouth daily. 90 tablet 3  . clopidogrel (PLAVIX) 75 MG tablet Take 1 tablet by mouth  daily 90 tablet 3  . Coenzyme Q10 (CO Q-10) 100 MG CAPS Take by mouth.    . ezetimibe (ZETIA) 10 MG tablet Take 1 tablet (10 mg total) by mouth daily. 90 tablet 3  . metoprolol succinate (TOPROL-XL) 25 MG 24 hr tablet Take 1 tablet (25 mg total) by mouth daily. Take with or immediately following a meal. 90 tablet 3  . Multiple Vitamin (MULTIVITAMIN) tablet Take 1 tablet by mouth daily.      . Omega-3 Fatty Acids (OMEGA-3 FISH OIL) 1200 MG CAPS Take 1,200 mg by mouth daily.     Marland Kitchen triamcinolone (KENALOG) 0.025 % ointment Apply 1 application topically 2 (two) times daily. 30 g 0   No current facility-administered medications for this visit.      Allergies:   Crestor [rosuvastatin]   Social History:  The patient  reports that he quit smoking about 36 years ago. His smoking use included Cigarettes. He has a 3.00 pack-year smoking history. He has never used smokeless tobacco. He reports that he drinks about 0.5 oz of alcohol  per week . He reports that he does not use drugs.   Family History:   family history includes CAD in his sister; CAD (age of onset: 5944) in his brother; CAD (age of onset: 6548) in his father; Heart disease in his mother; Stroke in his mother.    Review of Systems: Review of Systems  Constitutional: Negative.   Respiratory: Negative.   Cardiovascular: Negative.   Gastrointestinal: Negative.   Musculoskeletal: Negative.   Neurological: Negative.        Numbness in his feet  Psychiatric/Behavioral: Negative.   All other systems reviewed and are negative.    PHYSICAL EXAM: VS:  BP (!) 144/78 (BP Location: Left Arm, Patient Position: Sitting, Cuff Size: Normal)   Pulse (!) 56   Ht 5\' 11"  (1.803 m)   Wt 222 lb 12 oz (101 kg)    BMI 31.07 kg/m  , BMI Body mass index is 31.07 kg/m. GEN: Well nourished, well developed, in no acute distress  HEENT: normal  Neck: no JVD, carotid bruits, or masses Cardiac: RRR; no murmurs, rubs, or gallops,no edema  Respiratory:  clear to auscultation bilaterally, normal work of breathing GI: soft, nontender, nondistended, + BS MS: no deformity or atrophy  Skin: warm and dry, no rash Neuro:  Strength and sensation are intact Psych: euthymic mood, full affect    Recent Labs: 06/11/2015: ALT 33 09/19/2015: BUN 13; Creatinine, Ser 1.13; Sodium 142 09/24/2015: Potassium 5.0; TSH 0.89    Lipid Panel Lab Results  Component Value Date   CHOL 123 06/11/2015   HDL 27 (L) 06/11/2015   LDLCALC 59 06/11/2015   TRIG 184 (H) 06/11/2015      Wt Readings from Last 3 Encounters:  03/13/16 222 lb 12 oz (101 kg)  02/14/16 221 lb 12.8 oz (100.6 kg)  09/24/15 217 lb 4 oz (98.5 kg)       ASSESSMENT AND PLAN:  HYPERTENSION, BENIGN - Plan: EKG 12-Lead, Hepatic function panel, Lipid Profile Blood pressure is well controlled on today's visit. No changes made to the medications.  Atherosclerosis of coronary artery bypass graft of native heart without angina pectoris - Plan: EKG 12-Lead, Hepatic function panel, Lipid Profile Currently with no symptoms of angina. No further workup at this time. Continue current medication regimen.  Encounter for immunization - Plan: Flu Vaccine QUAD 36+ mos IM Flu shot provided  Paresthesia Numbness in his feet  Pure hypercholesterolemia Recommended repeat lipid panel on Lipitor 20 mg daily with zetia daily  Muscle cramping Improvement in muscle cramping by lowering dose of Lipitor   Total encounter time more than 25 minutes  Greater than 50% was spent in counseling and coordination of care with the patient   Disposition:   F/U  6 months   Orders Placed This Encounter  Procedures  . Flu Vaccine QUAD 36+ mos IM  . Hepatic function panel  .  Lipid Profile  . EKG 12-Lead     Signed, Dossie Arbourim Gollan, M.D., Ph.D. 03/13/2016  [facility], [place] [phone]

## 2016-03-13 NOTE — Patient Instructions (Addendum)
Medication Instructions:   No medication changes made  Labwork:  Liver and lipids through [facility]  Testing/Procedures:  No further testing at this time   Follow-Up: It was a pleasure seeing you in the office today. Please call us if you have new issues that need to be addressed before your next appt.  [phone]  Your physician wants you to follow-up in: 6 months.  You will receive a reminder letter in the mail two months in advance. If you don't receive a letter, please call our office to schedule the follow-up appointment.  If you need a refill on your cardiac medications before your next appointment, please call your pharmacy.

## 2016-05-25 ENCOUNTER — Other Ambulatory Visit: Payer: Self-pay | Admitting: Cardiovascular Disease

## 2016-09-16 ENCOUNTER — Telehealth: Payer: Self-pay | Admitting: Cardiovascular Disease

## 2016-09-16 NOTE — Telephone Encounter (Signed)
Pt is having a f/u appt with Dr. Mariah Milling on 5/1, and asks if he needs to get bloodwork before. Please call and advise.

## 2016-09-16 NOTE — Telephone Encounter (Signed)
Spoke w/ pt.  He is coming over for fasting liver & lipid tomorrow @ 8:10.

## 2016-09-17 ENCOUNTER — Other Ambulatory Visit (INDEPENDENT_AMBULATORY_CARE_PROVIDER_SITE_OTHER): Payer: Medicare HMO

## 2016-09-17 DIAGNOSIS — I10 Essential (primary) hypertension: Secondary | ICD-10-CM

## 2016-09-17 DIAGNOSIS — I25810 Atherosclerosis of coronary artery bypass graft(s) without angina pectoris: Secondary | ICD-10-CM

## 2016-09-18 LAB — LIPID PANEL
CHOL/HDL RATIO: 4.1 ratio (ref 0.0–5.0)
Cholesterol, Total: 127 mg/dL (ref 100–199)
HDL: 31 mg/dL — AB (ref >39)
LDL CALC: 54 mg/dL (ref 0–99)
TRIGLYCERIDES: 212 mg/dL — AB (ref 0–149)
VLDL Cholesterol Cal: 42 mg/dL — ABNORMAL HIGH (ref 5–40)

## 2016-09-18 LAB — HEPATIC FUNCTION PANEL
ALBUMIN: 4.6 g/dL (ref 3.6–4.8)
ALT: 21 IU/L (ref 0–44)
AST: 19 IU/L (ref 0–40)
Alkaline Phosphatase: 56 IU/L (ref 39–117)
Bilirubin Total: 0.5 mg/dL (ref 0.0–1.2)
Bilirubin, Direct: 0.15 mg/dL (ref 0.00–0.40)
TOTAL PROTEIN: 7.3 g/dL (ref 6.0–8.5)

## 2016-09-21 NOTE — Progress Notes (Signed)
Home in follow-up today Cardiology Office Note  Date:  09/23/2016   ID:  Leonard Hughes, DOB Oct 10, 1945, MRN [id number]  PCP:  Eustaquio Boyden, MD   Chief Complaint  Patient presents with  . OTHER    6 month f/u no complaints today. Meds reviewed verbally with pt.    HPI:  Leonard Hughes is a very pleasant 71 year old gentleman with past medical history of  coronary artery disease,  severe proximal LAD disease in 2001 with stent placed at that time,  bypass surgery by Dr. Dorris Fetch in March of 2007  for 75% left main disease,   obesity,   who presents for follow up of his coronary artery disease  In follow-up today he reports that he is doing well  Working in Airline pilot, no regular exercise  Rare  cramps in his feet Feet numb at night in bed, feeling cold, typically only around the toes  Lab work reviewed with him in detail  Labs 08/2015 Total chol 127, LDL 54  HBA1C 6.0  Tolerating Lipitor 40 ng daily, zetia He had myalgias on 80 mg daily  EKG personally reviewed by myself on todays visit Normal sinus rhythm with rate 58 bpm no significant ST or T-wave changes  Other past medical history  stress tests have come back positive both in 2001 and in  2007. 2001-lead to a stent in 2007, his bypass surgery.    PMH:   has a past medical history of Coronary artery disease (2007); History of chicken pox; Hyperlipidemia; and Hypertension.  PSH:    Past Surgical History:  Procedure Laterality Date  . CORONARY ANGIOPLASTY    . CORONARY ARTERY BYPASS GRAFT  2007   2 vessel, [place]  . PERCUTANEOUS CORONARY STENT INTERVENTION (PCI-S)  2001   mid L circ  . TONSILLECTOMY      Current Outpatient Prescriptions  Medication Sig Dispense Refill  . aspirin 81 MG EC tablet Take 81 mg by mouth daily.      Marland Kitchen atorvastatin (LIPITOR) 40 MG tablet Take 40 mg by mouth daily.    . clopidogrel (PLAVIX) 75 MG tablet Take 1 tablet by mouth  daily 90 tablet 3  . Coenzyme Q10 (CO Q-10) 100 MG  CAPS Take by mouth.    . ezetimibe (ZETIA) 10 MG tablet Take 1 tablet (10 mg total) by mouth daily. 90 tablet 3  . metoprolol succinate (TOPROL-XL) 25 MG 24 hr tablet TAKE 1 TABLET BY MOUTH  DAILY WITH OR IMMEDIATLEY  FOLLOWING A MEAL 90 tablet 3  . Multiple Vitamin (MULTIVITAMIN) tablet Take 1 tablet by mouth daily.      . Omega-3 Fatty Acids (OMEGA-3 FISH OIL) 1200 MG CAPS Take 1,200 mg by mouth daily.      No current facility-administered medications for this visit.      Allergies:   Crestor [rosuvastatin]   Social History:  The patient  reports that he quit smoking about 37 years ago. His smoking use included Cigarettes. He has a 3.00 pack-year smoking history. He has never used smokeless tobacco. He reports that he drinks about 0.5 oz of alcohol per week . He reports that he does not use drugs.   Family History:   family history includes CAD in his sister; CAD (age of onset: 79) in his brother; CAD (age of onset: 83) in his father; Heart disease in his mother; Stroke in his mother.    Review of Systems: Review of Systems  Constitutional: Negative.   Respiratory: Negative.  Cardiovascular: Negative.   Gastrointestinal: Negative.   Musculoskeletal: Negative.   Neurological: Negative.        Numbness in his feet  Psychiatric/Behavioral: Negative.   All other systems reviewed and are negative.    PHYSICAL EXAM: VS:  BP 138/72 (BP Location: Left Arm, Patient Position: Sitting, Cuff Size: Normal)   Pulse (!) 58   Ht  (1.803 m)   Wt 225 lb 8 oz (102.3 kg)   BMI 31.45 kg/m  , BMI Body mass index is 31.45 kg/m. GEN: Well nourished, well developed, in no acute distress  HEENT: normal  Neck: no JVD, carotid bruits, or masses Cardiac: RRR; no murmurs, rubs, or gallops,no edema  Respiratory:  clear to auscultation bilaterally, normal work of breathing GI: soft, nontender, nondistended, + BS MS: no deformity or atrophy  Skin: warm and dry, no rash Neuro:  Strength and  sensation are intact Psych: euthymic mood, full affect    Recent Labs: 09/24/2015: Potassium 5.0; TSH 0.89 09/17/2016: ALT 21    Lipid Panel Lab Results  Component Value Date   CHOL 127 09/17/2016   HDL 31 (L) 09/17/2016   LDLCALC 54 09/17/2016   TRIG 212 (H) 09/17/2016      Wt Readings from Last 3 Encounters:  09/23/16 225 lb 8 oz (102.3 kg)  03/13/16 222 lb 12 oz (101 kg)  02/14/16 221 lb 12.8 oz (100.6 kg)       ASSESSMENT AND PLAN:  HYPERTENSION, BENIGN -  Blood pressure is well controlled on today's visit. No changes made to the medications.  Atherosclerosis of coronary artery bypass graft of native heart without angina pectoris -  Currently with no symptoms of angina. No further workup at this time. Continue current medication regimen.  Paresthesia Numbness in his feet He feels it is secondary to the Lipitor He is taking B-12 If symptoms get worse, suggested he talk with podiatry  Pure hypercholesterolemia Cholesterol is at goal on the current lipid regimen. No changes to the medications were made.  Muscle cramping Tolerating Lipitor 40 mg daily still with cramps in his toes at times He is tolerating symptoms Long discussion with him concerning various other treatment options available including other statins He reports he did not tolerate Crestor   Total encounter time more than 25 minutes  Greater than 50% was spent in counseling and coordination of care with the patient   Disposition:   F/U  6 months   Orders Placed This Encounter  Procedures  . EKG 12-Lead     Signed, Dossie Arbour, M.D., Ph.D. 09/23/2016  [facility] [place], [place] [phone]

## 2016-09-23 ENCOUNTER — Encounter: Payer: Self-pay | Admitting: Cardiovascular Disease

## 2016-09-23 ENCOUNTER — Ambulatory Visit (INDEPENDENT_AMBULATORY_CARE_PROVIDER_SITE_OTHER): Payer: Medicare HMO | Admitting: Cardiovascular Disease

## 2016-09-23 VITALS — BP 138/72 | HR 58 | Ht 71.0 in | Wt 225.5 lb

## 2016-09-23 DIAGNOSIS — E7800 Pure hypercholesterolemia, unspecified: Secondary | ICD-10-CM | POA: Diagnosis not present

## 2016-09-23 DIAGNOSIS — I25810 Atherosclerosis of coronary artery bypass graft(s) without angina pectoris: Secondary | ICD-10-CM | POA: Diagnosis not present

## 2016-09-23 DIAGNOSIS — I10 Essential (primary) hypertension: Secondary | ICD-10-CM

## 2016-09-23 MED ORDER — ATORVASTATIN CALCIUM 40 MG PO TABS
40.0000 mg | ORAL_TABLET | Freq: Every day | ORAL | 3 refills | Status: DC
Start: 2016-09-23 — End: 2017-09-05

## 2016-09-23 MED ORDER — CLOPIDOGREL BISULFATE 75 MG PO TABS: 75.0000 mg | ORAL_TABLET | Freq: Every day | ORAL | 3 refills | Status: DC

## 2016-09-23 MED ORDER — EZETIMIBE 10 MG PO TABS
10.0000 mg | ORAL_TABLET | Freq: Every day | ORAL | 3 refills | Status: DC
Start: 2016-09-23 — End: 2017-09-05

## 2016-09-23 MED ORDER — METOPROLOL SUCCINATE ER 25 MG PO TB24: ORAL_TABLET | ORAL | 3 refills | Status: DC

## 2016-09-23 NOTE — Patient Instructions (Signed)

## 2016-09-23 NOTE — Addendum Note (Signed)
Addended by: Bryna Colander on: 09/23/2016 10:44 AM   Modules accepted: Orders

## 2016-09-24 ENCOUNTER — Other Ambulatory Visit: Payer: Self-pay | Admitting: Family Medicine

## 2016-09-24 DIAGNOSIS — Z125 Encounter for screening for malignant neoplasm of prostate: Secondary | ICD-10-CM

## 2016-09-24 DIAGNOSIS — E538 Deficiency of other specified B group vitamins: Secondary | ICD-10-CM

## 2016-09-24 DIAGNOSIS — R7303 Prediabetes: Secondary | ICD-10-CM

## 2016-09-25 ENCOUNTER — Other Ambulatory Visit (INDEPENDENT_AMBULATORY_CARE_PROVIDER_SITE_OTHER): Payer: Medicare HMO

## 2016-09-25 DIAGNOSIS — R7303 Prediabetes: Secondary | ICD-10-CM | POA: Diagnosis not present

## 2016-09-25 DIAGNOSIS — Z125 Encounter for screening for malignant neoplasm of prostate: Secondary | ICD-10-CM | POA: Diagnosis not present

## 2016-09-25 DIAGNOSIS — E538 Deficiency of other specified B group vitamins: Secondary | ICD-10-CM

## 2016-09-25 LAB — BASIC METABOLIC PANEL
BUN: 14 mg/dL (ref 6–23)
CHLORIDE: 106 meq/L (ref 96–112)
CO2: 32 mEq/L (ref 19–32)
Calcium: 9.4 mg/dL (ref 8.4–10.5)
Creatinine, Ser: 1.06 mg/dL (ref 0.40–1.50)
GFR: 73.71 mL/min (ref >60.00)
Glucose, Bld: 122 mg/dL — ABNORMAL HIGH (ref 70–99)
POTASSIUM: 5 meq/L (ref 3.5–5.1)
Sodium: 144 mEq/L (ref 135–145)

## 2016-09-25 LAB — VITAMIN B12: Vitamin B-12: 588 pg/mL (ref 211–911)

## 2016-09-25 LAB — HEMOGLOBIN A1C: HEMOGLOBIN A1C: 6 % (ref 4.6–6.5)

## 2016-09-25 LAB — PSA: PSA: 1.88 ng/mL (ref 0.10–4.00)

## 2016-09-29 ENCOUNTER — Ambulatory Visit (INDEPENDENT_AMBULATORY_CARE_PROVIDER_SITE_OTHER): Payer: Medicare HMO | Admitting: Family Medicine

## 2016-09-29 ENCOUNTER — Encounter: Payer: Self-pay | Admitting: Family Medicine

## 2016-09-29 VITALS — BP 132/70 | HR 55 | Temp 98.2°F | Ht 69.5 in | Wt 224.5 lb

## 2016-09-29 DIAGNOSIS — I10 Essential (primary) hypertension: Secondary | ICD-10-CM

## 2016-09-29 DIAGNOSIS — R7303 Prediabetes: Secondary | ICD-10-CM

## 2016-09-29 DIAGNOSIS — R0989 Other specified symptoms and signs involving the circulatory and respiratory systems: Secondary | ICD-10-CM | POA: Diagnosis not present

## 2016-09-29 DIAGNOSIS — Z7189 Other specified counseling: Secondary | ICD-10-CM

## 2016-09-29 DIAGNOSIS — Z1211 Encounter for screening for malignant neoplasm of colon: Secondary | ICD-10-CM | POA: Diagnosis not present

## 2016-09-29 DIAGNOSIS — I25810 Atherosclerosis of coronary artery bypass graft(s) without angina pectoris: Secondary | ICD-10-CM | POA: Diagnosis not present

## 2016-09-29 DIAGNOSIS — Z0000 Encounter for general adult medical examination without abnormal findings: Secondary | ICD-10-CM

## 2016-09-29 DIAGNOSIS — R202 Paresthesia of skin: Secondary | ICD-10-CM

## 2016-09-29 DIAGNOSIS — E7800 Pure hypercholesterolemia, unspecified: Secondary | ICD-10-CM

## 2016-09-29 NOTE — Assessment & Plan Note (Signed)
Chronic, stable. Continue current regimen. Followed by cardiology.

## 2016-09-29 NOTE — Progress Notes (Signed)
Pre visit review using our clinic review tool, if applicable. No additional management support is needed unless otherwise documented below in the visit note. 

## 2016-09-29 NOTE — Assessment & Plan Note (Signed)
Reviewed with patient, encouraged avoiding added sugars and sweetened beverages in diet.  

## 2016-09-29 NOTE — Progress Notes (Addendum)
BP 132/70   Pulse (!) 55   Temp 98.2 F (36.8 C) (Oral)   Ht 5' 9.5" (1.765 m)   Wt 224 lb 8 oz (101.8 kg)   SpO2 95%   BMI 32.68 kg/m    CC: welcome to medicare visit. Subjective:    Patient ID: Leonard Hughes, male    DOB: 05/23/1946, 71 y.o.   MRN: [id number]  HPI: Leonard Hughes is a 71 y.o. male presenting on 09/29/2016 for Annual Exam   Considering R knee replacement.  Some paresthesias of feet at night.  Received medicare 05/2016.  EKG last week with cardiology.  Ex smoker 1981 - 3-4 PY hx.   Hearing screen passed  Vision screen passed  Falls screen passed  Depression screen passed   Preventative: Colon cancer screening - normal iFOB. Advised to bring here to turn in.  Prostate cancer screening - PSA in past normal. Would like to continue screening.  Lung cancer screening - not eligible Flu shot - yearly Tdap 2017.  Pneumovax - 06/2013. prevnar 2016.  Shingles shot - 09/2015.  Advanced directive: has at home. HCPOA is wife. Will bring me copy. Seat belt use discussed Sunscreen use discussed. No changing moles on skin. Sees dermatologist.  Ex smoker - 3 PY hx Alcohol - 1-2 per day  Caffeine: 2-3 cups coffee/day  Lives with wife, mother in law, cats and dog. grown daughter. Occupation: Airline pilot, Scientist, water quality  Edu: BA at [employer]  Activity: walking 2 mi 5d/wk  Diet: good water, fruits/vegetables daily   Relevant past medical, surgical, family and social history reviewed and updated as indicated. Interim medical history since our last visit reviewed. Allergies and medications reviewed and updated. Outpatient Medications Prior to Visit  Medication Sig Dispense Refill  . aspirin 81 MG EC tablet Take 81 mg by mouth daily.      Marland Kitchen atorvastatin (LIPITOR) 40 MG tablet Take 1 tablet (40 mg total) by mouth daily. 90 tablet 3  . clopidogrel (PLAVIX) 75 MG tablet Take 1 tablet (75 mg total) by mouth daily. 90 tablet 3  . Coenzyme Q10 (CO Q-10) 100 MG CAPS Take by  mouth.    . ezetimibe (ZETIA) 10 MG tablet Take 1 tablet (10 mg total) by mouth daily. 90 tablet 3  . metoprolol succinate (TOPROL-XL) 25 MG 24 hr tablet TAKE 1 TABLET BY MOUTH DAILY WITH OR IMMEDIATLEY FOLLOWING A MEAL 90 tablet 3  . Multiple Vitamin (MULTIVITAMIN) tablet Take 1 tablet by mouth daily.      . Omega-3 Fatty Acids (OMEGA-3 FISH OIL) 1200 MG CAPS Take 1,200 mg by mouth daily.      No facility-administered medications prior to visit.      Per HPI unless specifically indicated in ROS section below Review of Systems  Constitutional: Negative for activity change, appetite change, chills, fatigue, fever and unexpected weight change.  HENT: Negative for hearing loss.   Eyes: Negative for visual disturbance.  Respiratory: Negative for cough, chest tightness, shortness of breath and wheezing.   Cardiovascular: Negative for chest pain, palpitations and leg swelling.  Gastrointestinal: Negative for abdominal distention, abdominal pain, blood in stool, constipation, diarrhea, nausea and vomiting.  Genitourinary: Negative for difficulty urinating and hematuria.  Musculoskeletal: Negative for arthralgias, myalgias and neck pain.  Skin: Negative for rash.  Neurological: Negative for dizziness, seizures, syncope and headaches.  Hematological: Negative for adenopathy. Bruises/bleeds easily.  Psychiatric/Behavioral: Negative for dysphoric mood. The patient is not nervous/anxious.  Objective:    BP 132/70   Pulse (!) 55   Temp 98.2 F (36.8 C) (Oral)   Ht 5' 9.5" (1.765 m)   Wt 224 lb 8 oz (101.8 kg)   SpO2 95%   BMI 32.68 kg/m   Wt Readings from Last 3 Encounters:  09/29/16 224 lb 8 oz (101.8 kg)  09/23/16 225 lb 8 oz (102.3 kg)  03/13/16 222 lb 12 oz (101 kg)    Physical Exam  Constitutional: He is oriented to person, place, and time. He appears well-developed and well-nourished. No distress.  HENT:  Head: Normocephalic and atraumatic.  Right Ear: Hearing, tympanic  membrane, external ear and ear canal normal.  Left Ear: Hearing, tympanic membrane, external ear and ear canal normal.  Nose: Nose normal.  Mouth/Throat: Uvula is midline, oropharynx is clear and moist and mucous membranes are normal. No oropharyngeal exudate, posterior oropharyngeal edema or posterior oropharyngeal erythema.  Eyes: Conjunctivae and EOM are normal. Pupils are equal, round, and reactive to light. No scleral icterus.  Neck: Normal range of motion. Neck supple. Carotid bruit is present (R sided). No thyromegaly present.  Cardiovascular: Normal rate, regular rhythm and intact distal pulses.   Murmur (2/6 SEM LUSB) heard. Pulses:      Radial pulses are 2+ on the right side, and 2+ on the left side.  Pulmonary/Chest: Effort normal and breath sounds normal. No respiratory distress. He has no wheezes. He has no rales.  Abdominal: Soft. Bowel sounds are normal. He exhibits no distension and no mass. There is no tenderness. There is no rebound and no guarding.  Genitourinary: Prostate normal. Rectal exam shows external hemorrhoid (noninflamed). Rectal exam shows no internal hemorrhoid, no fissure, no mass, no tenderness and anal tone normal. Prostate is not enlarged (20gm) and not tender.  Genitourinary Comments: Erythema around perianal region - pt states he uses hemorrhoidal wipes daily  Musculoskeletal: Normal range of motion. He exhibits no edema.  Lymphadenopathy:    He has no cervical adenopathy.  Neurological: He is alert and oriented to person, place, and time.  CN grossly intact, station and gait intact Recall 3/3 Calculation 5/5 serial 3s  Skin: Skin is warm and dry. No rash noted.  Psychiatric: He has a normal mood and affect. His behavior is normal. Judgment and thought content normal.  Nursing note and vitals reviewed.  Results for orders placed or performed in visit on 09/25/16  Basic metabolic panel  Result Value Ref Range   Sodium 144 135 - 145 mEq/L   Potassium  5.0 3.5 - 5.1 mEq/L   Chloride 106 96 - 112 mEq/L   CO2 32 19 - 32 mEq/L   Glucose, Bld 122 (H) 70 - 99 mg/dL   BUN 14 6 - 23 mg/dL   Creatinine, Ser 7.821.06 0.40 - 1.50 mg/dL   Calcium 9.4 8.4 - 95.610.5 mg/dL   GFR 21.3073.71 >86.57>60.00 mL/min  Hemoglobin A1c  Result Value Ref Range   Hgb A1c MFr Bld 6.0 4.6 - 6.5 %  Vitamin B12  Result Value Ref Range   Vitamin B-12 588 211 - 911 pg/mL  PSA  Result Value Ref Range   PSA 1.88 0.10 - 4.00 ng/mL      Assessment & Plan:   Problem List Items Addressed This Visit    Advanced care planning/counseling discussion    Advanced directive: has at home. HCPOA is wife. Will bring me copy.      CAD, ARTERY BYPASS GRAFT    asxs.  Continue aspirin, plavix, statin      HLD (hyperlipidemia)    Chronic, stable. Continue current regimen. Followed by cardiology.       HYPERTENSION, BENIGN    Chronic, stable. Continue current regimen.       Paresthesia    Started b12 last week.  Feels improvement in paresthesias.       Prediabetes    Reviewed with patient, encouraged avoiding added sugars and sweetened beverages in diet.      Welcome to Medicare preventive visit - Primary    I have personally reviewed the Medicare Annual Wellness questionnaire and have noted 1. The patient's medical and social history 2. Their use of alcohol, tobacco or illicit drugs 3. Their current medications and supplements 4. The patient's functional ability including ADL's, fall risks, home safety risks and hearing or visual impairment. Cognitive function has been assessed and addressed as indicated.  5. Diet and physical activity 6. Evidence for depression or mood disorders The patients weight, height, BMI have been recorded in the chart. I have made referrals, counseling and provided education to the patient based on review of the above and I have provided the pt with a written personalized care plan for preventive services. Provider list updated.. See scanned  questionairre as needed for further documentation. Reviewed preventative protocols and updated unless pt declined.  EKG not performed - pt had one done last week with cardiology       Other Visit Diagnoses    Right carotid bruit       Relevant Orders   VAS US CAROTID   Special screening for malignant neoplasms, colon       Relevant Orders   Fecal occult blood, imunochemical       Follow up plan: Return in about 1 year (around 09/29/2017) for annual exam, prior fasting for blood work, medicare wellness visit.  Eustaquio Boyden, MD

## 2016-09-29 NOTE — Assessment & Plan Note (Signed)
Chronic, stable. Continue current regimen. 

## 2016-09-29 NOTE — Patient Instructions (Addendum)
Pass by lab to pick up stool kit. Bring me copy of your advanced directives to update chart. See Rosaria Ferries to schedule carotid artery ultrasound.  You are doing well today - continue current medicines. Return as needed or in 1 year for next physical.  Health Maintenance, Male A healthy lifestyle and preventive care is important for your health and wellness. Ask your health care provider about what schedule of regular examinations is right for you. What should I know about weight and diet?  Eat a Healthy Diet  Eat plenty of vegetables, fruits, whole grains, low-fat dairy products, and lean protein.  Do not eat a lot of foods high in solid fats, added sugars, or salt. Maintain a Healthy Weight  Regular exercise can help you achieve or maintain a healthy weight. You should:  Do at least 150 minutes of exercise each week. The exercise should increase your heart rate and make you sweat (moderate-intensity exercise).  Do strength-training exercises at least twice a week. Watch Your Levels of Cholesterol and Blood Lipids  Have your blood tested for lipids and cholesterol every 5 years starting at 69 years of age. If you are at high risk for heart disease, you should start having your blood tested when you are 69 years old. You may need to have your cholesterol levels checked more often if:  Your lipid or cholesterol levels are high.  You are older than 69 years of age.  You are at high risk for heart disease. What should I know about cancer screening? Many types of cancers can be detected early and may often be prevented. Lung Cancer  You should be screened every year for lung cancer if:  You are a current smoker who has smoked for at least 30 years.  You are a former smoker who has quit within the past 15 years.  Talk to your health care provider about your screening options, when you should start screening, and how often you should be screened. Colorectal Cancer  Routine colorectal  cancer screening usually begins at 69 years of age and should be repeated every 5-10 years until you are 69 years old. You may need to be screened more often if early forms of precancerous polyps or small growths are found. Your health care provider may recommend screening at an earlier age if you have risk factors for colon cancer.  Your health care provider may recommend using home test kits to check for hidden blood in the stool.  A small camera at the end of a tube can be used to examine your colon (sigmoidoscopy or colonoscopy). This checks for the earliest forms of colorectal cancer. Prostate and Testicular Cancer  Depending on your age and overall health, your health care provider may do certain tests to screen for prostate and testicular cancer.  Talk to your health care provider about any symptoms or concerns you have about testicular or prostate cancer. Skin Cancer  Check your skin from head to toe regularly.  Tell your health care provider about any new moles or changes in moles, especially if:  There is a change in a mole's size, shape, or color.  You have a mole that is larger than a pencil eraser.  Always use sunscreen. Apply sunscreen liberally and repeat throughout the day.  Protect yourself by wearing long sleeves, pants, a wide-brimmed hat, and sunglasses when outside. What should I know about heart disease, diabetes, and high blood pressure?  If you are 81-53 years of age, have your  blood pressure checked every 3-5 years. If you are 30 years of age or older, have your blood pressure checked every year. You should have your blood pressure measured twice-once when you are at a hospital or clinic, and once when you are not at a hospital or clinic. Record the average of the two measurements. To check your blood pressure when you are not at a hospital or clinic, you can use:  An automated blood pressure machine at a pharmacy.  A home blood pressure monitor.  Talk to your  health care provider about your target blood pressure.  If you are between 22-56 years old, ask your health care provider if you should take aspirin to prevent heart disease.  Have regular diabetes screenings by checking your fasting blood sugar level.  If you are at a normal weight and have a low risk for diabetes, have this test once every three years after the age of 1.  If you are overweight and have a high risk for diabetes, consider being tested at a younger age or more often.  A one-time screening for abdominal aortic aneurysm (AAA) by ultrasound is recommended for men aged 22-75 years who are current or former smokers. What should I know about preventing infection? Hepatitis B  If you have a higher risk for hepatitis B, you should be screened for this virus. Talk with your health care provider to find out if you are at risk for hepatitis B infection. Hepatitis C  Blood testing is recommended for:  Everyone born from [birth year] through 1963.  Anyone with known risk factors for hepatitis C. Sexually Transmitted Diseases (STDs)  You should be screened each year for STDs including gonorrhea and chlamydia if:  You are sexually active and are younger than 69 years of age.  You are older than 69 years of age and your health care provider tells you that you are at risk for this type of infection.  Your sexual activity has changed since you were last screened and you are at an increased risk for chlamydia or gonorrhea. Ask your health care provider if you are at risk.  Talk with your health care provider about whether you are at high risk of being infected with HIV. Your health care provider may recommend a prescription medicine to help prevent HIV infection. What else can I do?  Schedule regular health, dental, and eye exams.  Stay current with your vaccines (immunizations).  Do not use any tobacco products, such as cigarettes, chewing tobacco, and e-cigarettes. If you need help  quitting, ask your health care provider.  Limit alcohol intake to no more than 2 drinks per day. One drink equals 12 ounces of beer, 5 ounces of wine, or 1 ounces of hard liquor.  Do not use street drugs.  Do not share needles.  Ask your health care provider for help if you need support or information about quitting drugs.  Tell your health care provider if you often feel depressed.  Tell your health care provider if you have ever been abused or do not feel safe at home. This information is not intended to replace advice given to you by your health care provider. Make sure you discuss any questions you have with your health care provider. Document Released: 11/08/2007 Document Revised: 01/09/2016 Document Reviewed: 02/13/2015 Elsevier Interactive Patient Education  2017 [employer].

## 2016-09-29 NOTE — Assessment & Plan Note (Signed)
Advanced directive: has at home. HCPOA is wife. Will bring me copy.

## 2016-09-29 NOTE — Assessment & Plan Note (Signed)
I have personally reviewed the Medicare Annual Wellness questionnaire and have noted 1. The patient's medical and social history 2. Their use of alcohol, tobacco or illicit drugs 3. Their current medications and supplements 4. The patient's functional ability including ADL's, fall risks, home safety risks and hearing or visual impairment. Cognitive function has been assessed and addressed as indicated.  5. Diet and physical activity 6. Evidence for depression or mood disorders The patients weight, height, BMI have been recorded in the chart. I have made referrals, counseling and provided education to the patient based on review of the above and I have provided the pt with a written personalized care plan for preventive services. Provider list updated.. See scanned questionairre as needed for further documentation. Reviewed preventative protocols and updated unless pt declined.  EKG not performed - pt had one done last week with cardiology

## 2016-09-29 NOTE — Assessment & Plan Note (Signed)
Started b12 last week.  Feels improvement in paresthesias.

## 2016-09-29 NOTE — Assessment & Plan Note (Signed)
asxs. Continue aspirin, plavix, statin

## 2016-10-30 ENCOUNTER — Ambulatory Visit: Payer: Medicare HMO

## 2016-10-30 DIAGNOSIS — I6523 Occlusion and stenosis of bilateral carotid arteries: Secondary | ICD-10-CM | POA: Diagnosis not present

## 2016-10-30 DIAGNOSIS — R0989 Other specified symptoms and signs involving the circulatory and respiratory systems: Secondary | ICD-10-CM | POA: Diagnosis not present

## 2016-10-30 LAB — VAS US CAROTID
LEFT ECA DIAS: -8 cm/s
LEFT VERTEBRAL DIAS: 20 cm/s
Left CCA dist dias: 29 cm/s
Left CCA dist sys: 120 cm/s
Left CCA prox dias: 27 cm/s
Left CCA prox sys: 139 cm/s
Left ICA dist dias: -38 cm/s
Left ICA dist sys: -159 cm/s
Left ICA prox dias: 33 cm/s
Left ICA prox sys: 133 cm/s
RIGHT ECA DIAS: -12 cm/s
RIGHT VERTEBRAL DIAS: 11 cm/s
Right CCA prox dias: 16 cm/s
Right CCA prox sys: 122 cm/s
Right cca dist sys: -129 cm/s

## 2016-11-01 ENCOUNTER — Encounter: Payer: Self-pay | Admitting: Family Medicine

## 2016-11-01 DIAGNOSIS — I6529 Occlusion and stenosis of unspecified carotid artery: Secondary | ICD-10-CM | POA: Insufficient documentation

## 2017-03-02 ENCOUNTER — Ambulatory Visit: Payer: Medicare HMO

## 2017-03-02 ENCOUNTER — Ambulatory Visit (INDEPENDENT_AMBULATORY_CARE_PROVIDER_SITE_OTHER): Payer: Medicare HMO | Admitting: Podiatry

## 2017-03-02 VITALS — BP 149/70 | HR 69 | Resp 16

## 2017-03-02 DIAGNOSIS — G629 Polyneuropathy, unspecified: Secondary | ICD-10-CM | POA: Diagnosis not present

## 2017-03-02 DIAGNOSIS — M779 Enthesopathy, unspecified: Secondary | ICD-10-CM

## 2017-03-02 MED ORDER — GABAPENTIN 300 MG PO CAPS: 300.0000 mg | ORAL_CAPSULE | Freq: Every day | ORAL | 3 refills | Status: DC

## 2017-03-02 NOTE — Progress Notes (Signed)
Subjective:  Patient ID: Leonard Hughes, male    DOB: April 18, 1946,  MRN: [id number] HPI Chief Complaint  Patient presents with  . Toe Pain    Toes (all) bilateral - numbness and cramping x few months, no pain, thought it could be from taking taking "statin" drugs, worse at night    71 y.o. male presents with the above complaint.     Past Medical History:  Diagnosis Date  . Coronary artery disease 2007   CABG 2v Dorris Fetch)  . History of chicken pox   . Hyperlipidemia   . Hypertension    Past Surgical History:  Procedure Laterality Date  . CORONARY ANGIOPLASTY    . CORONARY ARTERY BYPASS GRAFT  2007   2 vessel, [facility]  . PERCUTANEOUS CORONARY STENT INTERVENTION (PCI-S)  2001   mid L circ  . TONSILLECTOMY      Current Outpatient Prescriptions:  .  aspirin 81 MG EC tablet, Take 81 mg by mouth daily.  , Disp: , Rfl:  .  atorvastatin (LIPITOR) 40 MG tablet, Take 1 tablet (40 mg total) by mouth daily., Disp: 90 tablet, Rfl: 3 .  clopidogrel (PLAVIX) 75 MG tablet, Take 1 tablet (75 mg total) by mouth daily., Disp: 90 tablet, Rfl: 3 .  Coenzyme Q10 (CO Q-10) 100 MG CAPS, Take by mouth., Disp: , Rfl:  .  ezetimibe (ZETIA) 10 MG tablet, Take 1 tablet (10 mg total) by mouth daily., Disp: 90 tablet, Rfl: 3 .  magnesium oxide (MAG-OX) 400 MG tablet, Take 400 mg by mouth daily. , Disp: , Rfl:  .  metoprolol succinate (TOPROL-XL) 25 MG 24 hr tablet, TAKE 1 TABLET BY MOUTH DAILY WITH OR IMMEDIATLEY FOLLOWING A MEAL, Disp: 90 tablet, Rfl: 3 .  Multiple Vitamin (MULTIVITAMIN) tablet, Take 1 tablet by mouth daily.  , Disp: , Rfl:  .  Omega-3 Fatty Acids (OMEGA-3 FISH OIL) 1200 MG CAPS, Take 1,200 mg by mouth daily. , Disp: , Rfl:  .  vitamin B-12 (CYANOCOBALAMIN) 500 MCG tablet, Take 500 mcg by mouth daily., Disp: , Rfl:   Allergies  Allergen Reactions  . Crestor [Rosuvastatin] Other (See Comments)    myalgias   Review of Systems  Musculoskeletal: Positive for arthralgias.    Neurological: Positive for numbness.  All other systems reviewed and are negative.  Objective:   Vitals:   03/02/17 1451  BP: (!) 149/70  Pulse: 69  Resp: 16    General: Well developed, nourished, in no acute distress, alert and oriented x3   Dermatological: Skin is warm, dry and supple bilateral. Nails x 10 are well maintained; remaining integument appears unremarkable at this time. There are no open sores, no preulcerative lesions, no rash or signs of infection present.  Vascular: Dorsalis Pedis artery and Posterior Tibial artery pedal pulses are 2/4 bilateral with immedate capillary fill time. Pedal hair growth present. No varicosities and no lower extremity edema present bilateral.   NeruologicLight touch and proprioceptive is lost distally. Vibratory lost via tuning fork bilateral. Protective threshold with Semmes Wienstein monofilament diminished to all pedal sites bilateral. Patellar and Achilles deep tendon reflexes 2+ bilateral. No Babinski or clonus noted bilateral.   Musculoskeletal: No gross boney pedal deformities bilateral. No pain, crepitus, or limitation noted with foot and ankle range of motion bilateral. Muscular strength 5/5 in all groups tested bilateral.  Gait: Unassisted, Nonantalgic.    Radiographs:  None taken  Assessment & Plan:   Assessment: Idiopathic neuropathy  Plan: Started him on gabapentin  300 mg 1 by mouth daily at bedtime we'll follow-up with him in one month may consider nerve conduction velocity exam.     Max T. [place], [place]

## 2017-03-23 NOTE — Progress Notes (Signed)
Home in follow-up today Cardiology Office Note  Date:  03/24/2017   ID:  Leonard BayleyMichael A Hughes, DOB 08/15/1945, MRN [id number]  PCP:  Eustaquio BoydenGutierrez, Javier, MD   Chief Complaint  Patient presents with  . other    6 month follow up. Patient states he is doing well. Meds reviewed verbally with patient.     HPI:  Leonard Hughes is a very pleasant 71 year old gentleman with past medical history of  coronary artery disease,  severe proximal LAD disease in 2001 with stent placed at that time,  bypass surgery by Dr. 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, no regular exercise  On the road, poor diet weight has been trending upwards Discussed his elevated glucose levels with him Weight up 6 pounds from her prior clinic visit Denies any chest pain concerning for angina No significant shortness of breath Overall feels well with no complaints  Lab work reviewed with him in detail  Labs 08/2015 Total chol 127, LDL 54 HBA1C 6.0  Tolerating Lipitor 40 ng daily, zetia He had myalgias on 80 mg daily  EKG personally reviewed by myself on todays visit Normal sinus rhythm with rate 66 bpm no significant ST or T-wave changes  Other past medical history  stress tests have come back positive both in 2001 and in  2007. 2001-lead to a stent in 2007, his bypass surgery.    PMH:   has a past medical history of Coronary artery disease (2007); History of chicken pox; Hyperlipidemia; and Hypertension.  PSH:    Past Surgical History:  Procedure Laterality Date  . CORONARY ANGIOPLASTY    . CORONARY ARTERY BYPASS GRAFT  2007   2 vessel, [facility]  . PERCUTANEOUS CORONARY STENT INTERVENTION (PCI-S)  2001   mid L circ  . TONSILLECTOMY      Current Outpatient Prescriptions  Medication Sig Dispense Refill   . aspirin 81 MG EC tablet Take 81 mg by mouth daily.      Marland Kitchen. atorvastatin (LIPITOR) 40 MG tablet Take 1 tablet (40 mg total) by mouth daily. 90 tablet 3  . clopidogrel (PLAVIX) 75 MG tablet Take 1 tablet (75 mg total) by mouth daily. 90 tablet 3  . Coenzyme Q10 (CO Q-10) 100 MG CAPS Take by mouth.    . ezetimibe (ZETIA) 10 MG tablet Take 1 tablet (10 mg total) by mouth daily. 90 tablet 3  . gabapentin (NEURONTIN) 300 MG capsule Take 1 capsule (300 mg total) by mouth at bedtime. 30 capsule 3  . magnesium oxide (MAG-OX) 400 MG tablet Take 400 mg by mouth daily.     . metoprolol succinate (TOPROL-XL) 25 MG 24 hr tablet TAKE 1 TABLET BY MOUTH DAILY WITH OR IMMEDIATLEY FOLLOWING A MEAL 90 tablet 3  . Multiple Vitamin (MULTIVITAMIN) tablet Take 1 tablet by mouth daily.      . Omega-3 Fatty Acids (OMEGA-3 FISH OIL) 1200 MG CAPS Take 1,200 mg by mouth daily.     . vitamin B-12 (CYANOCOBALAMIN) 500 MCG tablet Take 500 mcg by mouth daily.     No current facility-administered medications for this visit.      Allergies:   Crestor [rosuvastatin]   Social History:  The patient  reports that he quit smoking about 37 years ago. His smoking use  included Cigarettes. He has a 3.00 pack-year smoking history. He has never used smokeless tobacco. He reports that he drinks about 0.5 oz of alcohol per week . He reports that he does not use drugs.   Family History:   family history includes CAD in his sister; CAD (age of onset: 34) in his brother; CAD (age of onset: 69) in his father; Heart disease in his mother; Stroke in his mother.    Review of Systems: Review of Systems  Constitutional: Negative.   Respiratory: Negative.   Cardiovascular: Negative.   Gastrointestinal: Negative.   Musculoskeletal: Negative.   Neurological: Negative.        Numbness in his feet/toes  Psychiatric/Behavioral: Negative.   All other systems reviewed and are negative.    PHYSICAL EXAM: VS:  BP (!) 160/74 (BP Location:  Left Arm, Patient Position: Sitting, Cuff Size: Normal)   Pulse 66   Ht 5\' 11"  (1.803 m)   Wt 231 lb 8 oz (105 kg)   BMI 32.29 kg/m  , BMI Body mass index is 32.29 kg/m.  No significant change in exam:  Well nourished, well developed, in no acute distress  HEENT: normal  Neck: no JVD, carotid bruits, or masses Cardiac: RRR; no murmurs, rubs, or gallops,no edema  Respiratory:  clear to auscultation bilaterally, normal work of breathing GI: soft, nontender, nondistended, + BS MS: no deformity or atrophy  Skin: warm and dry, no rash Neuro:  Strength and sensation are intact Psych: euthymic mood, full affect    Recent Labs: 09/17/2016: ALT 21 09/25/2016: BUN 14; Creatinine, Ser 1.06; Potassium 5.0; Sodium 144    Lipid Panel Lab Results  Component Value Date   CHOL 127 09/17/2016   HDL 31 (L) 09/17/2016   LDLCALC 54 09/17/2016   TRIG 212 (H) 09/17/2016      Wt Readings from Last 3 Encounters:  03/24/17 231 lb 8 oz (105 kg)  09/29/16 224 lb 8 oz (101.8 kg)  09/23/16 225 lb 8 oz (102.3 kg)       ASSESSMENT AND PLAN:  HYPERTENSION, BENIGN -  Elevated BP today,  Weight higher Recommended weight loss He will call us in several days with blood pressure measurements from home.  If blood pressure continues to run high, we will add blood pressure pill  Atherosclerosis of coronary artery bypass graft of native heart without angina pectoris -  Currently with no symptoms of angina. No further workup at this time. Continue current medication regimen.  Stable  Paresthesia Numbness in his feet Started on Neurontin by podiatry  Pure hypercholesterolemia Cholesterol is at goal on the current lipid regimen. No changes to the medications were made.  Elevated glucose Recommended dietary changes, exercise for weight loss  Muscle cramping Tolerating Lipitor 40 mg daily  Cholesterol at goal   Total encounter time more than 25 minutes  Greater than 50% was spent in counseling  and coordination of care with the patient   Disposition:   F/U  12 months   No orders of the defined types were placed in this encounter.    Signed, Dossie Arbour, M.D., Ph.D. 03/24/2017  [facility] [place], [place] [phone]

## 2017-03-24 ENCOUNTER — Encounter: Payer: Self-pay | Admitting: Cardiovascular Disease

## 2017-03-24 ENCOUNTER — Ambulatory Visit (INDEPENDENT_AMBULATORY_CARE_PROVIDER_SITE_OTHER): Payer: Medicare HMO | Admitting: Cardiovascular Disease

## 2017-03-24 VITALS — BP 160/74 | HR 66 | Ht 71.0 in | Wt 231.5 lb

## 2017-03-24 DIAGNOSIS — E7800 Pure hypercholesterolemia, unspecified: Secondary | ICD-10-CM

## 2017-03-24 DIAGNOSIS — I10 Essential (primary) hypertension: Secondary | ICD-10-CM

## 2017-03-24 DIAGNOSIS — R202 Paresthesia of skin: Secondary | ICD-10-CM | POA: Diagnosis not present

## 2017-03-24 DIAGNOSIS — I25708 Atherosclerosis of coronary artery bypass graft(s), unspecified, with other forms of angina pectoris: Secondary | ICD-10-CM | POA: Diagnosis not present

## 2017-03-24 DIAGNOSIS — I6523 Occlusion and stenosis of bilateral carotid arteries: Secondary | ICD-10-CM | POA: Diagnosis not present

## 2017-03-24 DIAGNOSIS — Z23 Encounter for immunization: Secondary | ICD-10-CM

## 2017-03-24 NOTE — Patient Instructions (Signed)

## 2017-05-04 ENCOUNTER — Ambulatory Visit: Payer: Medicare HMO | Admitting: Podiatry

## 2017-06-10 ENCOUNTER — Encounter: Payer: Self-pay | Admitting: Podiatry

## 2017-06-10 ENCOUNTER — Ambulatory Visit: Payer: Medicare HMO | Admitting: Podiatry

## 2017-06-10 DIAGNOSIS — G609 Hereditary and idiopathic neuropathy, unspecified: Secondary | ICD-10-CM

## 2017-06-10 NOTE — Progress Notes (Signed)
He presents today for follow-up of his neuropathy forefoot bilaterally.  He states that they do still feel numb and cold and stay that way.  He states that he took 300 mg of gabapentin at nighttime and did not even make him sleepy.  He said in order to help my feet at all.  Objective: Vital signs are stable he is alert oriented x3 pulses are strongly palpable.  No change from previous visit.  He still has protective sensation.  Assessment: Idiopathic neuropathy bilateral.  Plan: Offered referring him to neurology but he declined at this point.  States that he would talk to his primary care provider about this.

## 2017-09-05 ENCOUNTER — Other Ambulatory Visit: Payer: Self-pay | Admitting: Cardiovascular Disease

## 2017-10-01 ENCOUNTER — Other Ambulatory Visit: Payer: Self-pay | Admitting: Family Medicine

## 2017-10-01 DIAGNOSIS — E7800 Pure hypercholesterolemia, unspecified: Secondary | ICD-10-CM

## 2017-10-01 DIAGNOSIS — Z125 Encounter for screening for malignant neoplasm of prostate: Secondary | ICD-10-CM

## 2017-10-01 DIAGNOSIS — I10 Essential (primary) hypertension: Secondary | ICD-10-CM

## 2017-10-01 DIAGNOSIS — E782 Mixed hyperlipidemia: Secondary | ICD-10-CM

## 2017-10-01 DIAGNOSIS — E538 Deficiency of other specified B group vitamins: Secondary | ICD-10-CM

## 2017-10-01 DIAGNOSIS — R7303 Prediabetes: Secondary | ICD-10-CM

## 2017-10-02 ENCOUNTER — Encounter: Payer: Self-pay | Admitting: Family Medicine

## 2017-10-02 ENCOUNTER — Ambulatory Visit: Payer: Medicare HMO

## 2017-10-02 ENCOUNTER — Ambulatory Visit (INDEPENDENT_AMBULATORY_CARE_PROVIDER_SITE_OTHER): Payer: Medicare HMO | Admitting: Family Medicine

## 2017-10-02 ENCOUNTER — Other Ambulatory Visit: Payer: Self-pay

## 2017-10-02 VITALS — BP 140/74 | HR 62 | Temp 98.0°F | Resp 16 | Ht 69.5 in | Wt 227.0 lb

## 2017-10-02 DIAGNOSIS — R202 Paresthesia of skin: Secondary | ICD-10-CM

## 2017-10-02 DIAGNOSIS — R7303 Prediabetes: Secondary | ICD-10-CM

## 2017-10-02 DIAGNOSIS — Z0000 Encounter for general adult medical examination without abnormal findings: Secondary | ICD-10-CM

## 2017-10-02 DIAGNOSIS — E7800 Pure hypercholesterolemia, unspecified: Secondary | ICD-10-CM | POA: Diagnosis not present

## 2017-10-02 DIAGNOSIS — Z125 Encounter for screening for malignant neoplasm of prostate: Secondary | ICD-10-CM | POA: Diagnosis not present

## 2017-10-02 DIAGNOSIS — I6522 Occlusion and stenosis of left carotid artery: Secondary | ICD-10-CM

## 2017-10-02 DIAGNOSIS — R7989 Other specified abnormal findings of blood chemistry: Secondary | ICD-10-CM

## 2017-10-02 DIAGNOSIS — I10 Essential (primary) hypertension: Secondary | ICD-10-CM | POA: Diagnosis not present

## 2017-10-02 DIAGNOSIS — K219 Gastro-esophageal reflux disease without esophagitis: Secondary | ICD-10-CM | POA: Diagnosis not present

## 2017-10-02 DIAGNOSIS — E538 Deficiency of other specified B group vitamins: Secondary | ICD-10-CM

## 2017-10-02 DIAGNOSIS — Z1211 Encounter for screening for malignant neoplasm of colon: Secondary | ICD-10-CM

## 2017-10-02 MED ORDER — OMEPRAZOLE 20 MG PO CPDR
20.0000 mg | DELAYED_RELEASE_CAPSULE | Freq: Every day | ORAL | 3 refills | Status: DC
Start: 2017-10-02 — End: 2018-07-16

## 2017-10-02 NOTE — Patient Instructions (Addendum)
   The CDC recommends two doses of Shingrix (the shingles vaccine) separated by 2 to 6 months for adults age 70 years and older. I recommend checking with your pharmacy plan regarding coverage for this vaccine.    Please go to the lab draw center in [address] on the second floor of [facility] when you are fasting

## 2017-10-02 NOTE — Progress Notes (Signed)
Patient: Leonard Hughes, Male    DOB: [birth year], 72 y.o.   MRN: [id number] Visit Date: 10/02/2017  Today's Provider: Mila Merry, MD   Chief Complaint  Patient presents with  . Establish Care  . Medicare Wellness   Subjective:    Annual physical Leonard Hughes is a 72 y.o. male. He feels well. He reports exercising none. He reports he is sleeping well. He is transferring from Dr. Sharen Hones. He is followed by Dr. Nadara Mode for CAD, s/p CABG, no history of MI. Mild  Asymptomatic left carotid artery disease by u/s 2018. Still works full time, enjoys working. Has occasional numbness in toes. He does have frequent heart burn if he does not take Prilosec everyday and requests prescription for reflux medication.   -----------------------------------------------------------   Review of Systems  Constitutional: Negative for chills, diaphoresis and fever.  HENT: Negative for congestion, ear discharge, ear pain, hearing loss, nosebleeds, sore throat and tinnitus.   Eyes: Negative for photophobia, pain, discharge and redness.  Respiratory: Negative for cough, shortness of breath, wheezing and stridor.   Cardiovascular: Negative for chest pain, palpitations and leg swelling.  Gastrointestinal: Negative for abdominal pain, blood in stool, constipation, diarrhea, nausea and vomiting.  Endocrine: Negative for polydipsia.  Genitourinary: Negative for dysuria, flank pain, frequency, hematuria and urgency.  Musculoskeletal: Negative for back pain, myalgias and neck pain.  Skin: Negative for rash.  Allergic/Immunologic: Negative for environmental allergies.  Neurological: Negative for dizziness, tremors, seizures, weakness and headaches.  Hematological: Does not bruise/bleed easily.  Psychiatric/Behavioral: Negative for hallucinations and suicidal ideas. The patient is not nervous/anxious.     Social History   Socioeconomic History  . Marital status: Married    Spouse name: Not on  file  . Number of children: Not on file  . Years of education: Not on file  . Highest education level: Not on file  Occupational History  . Not on file  Social Needs  . Financial resource strain: Not on file  . Food insecurity:    Worry: Not on file    Inability: Not on file  . Transportation needs:    Medical: Not on file    Non-medical: Not on file  Tobacco Use  . Smoking status: Former Smoker    Packs/day: 1.00    Years: 3.00    Pack years: 3.00    Types: Cigarettes    Last attempt to quit: 08/26/1979    Years since quitting: 38.1  . Smokeless tobacco: Never Used  Substance and Sexual Activity  . Alcohol use: Yes    Alcohol/week: 0.5 oz    Types: 1 Standard drinks or equivalent per week    Comment: Socially  . Drug use: No  . Sexual activity: Not on file  Lifestyle  . Physical activity:    Days per week: Not on file    Minutes per session: Not on file  . Stress: Not on file  Relationships  . Social connections:    Talks on phone: Not on file    Gets together: Not on file    Attends religious service: Not on file    Active member of club or organization: Not on file    Attends meetings of clubs or organizations: Not on file    Relationship status: Not on file  . Intimate partner violence:    Fear of current or ex partner: Not on file    Emotionally abused: Not on file    Physically  abused: Not on file    Forced sexual activity: Not on file  Other Topics Concern  . Not on file  Social History Narrative   Caffeine: 2-3 cups coffee/day   Lives with wife, mother in law, cats and dog.  grown daughter   Occupation: Airline pilot, Scientist, water quality   Edu: BA at [employer]   Activity: walking 2 mi 5d/wk   Diet: good water, fruits/vegetables daily    Past Medical History:  Diagnosis Date  . Coronary artery disease 2007   CABG 2v Dorris Fetch)  . History of chicken pox      Patient Active Problem List   Diagnosis Date Noted  . Carotid stenosis 11/01/2016  . Low vitamin B12  level 09/25/2015  . Prediabetes 09/24/2015  . Advanced care planning/counseling discussion 07/03/2014  . TMJ pain dysfunction syndrome 06/28/2013  . Paresthesia 06/28/2013  . Healthcare maintenance 06/28/2012  . HLD (hyperlipidemia) 07/03/2009  . HYPERTENSION, BENIGN 07/03/2009  . CAD, ARTERY BYPASS GRAFT 07/03/2009    Past Surgical History:  Procedure Laterality Date  . CORONARY ANGIOPLASTY    . CORONARY ARTERY BYPASS GRAFT  2007   2 vessel, [facility]  . PERCUTANEOUS CORONARY STENT INTERVENTION (PCI-S)  2001   mid L circ  . TONSILLECTOMY      His family history includes CAD in his sister; CAD (age of onset: 64) in his brother; CAD (age of onset: 24) in his father; Heart disease in his mother; Stroke in his mother. There is no history of Diabetes or Cancer.      Current Outpatient Medications:  .  aspirin 81 MG EC tablet, Take 81 mg by mouth daily.  , Disp: , Rfl:  .  atorvastatin (LIPITOR) 40 MG tablet, TAKE 1 TABLET EVERY DAY, Disp: 90 tablet, Rfl: 3 .  clopidogrel (PLAVIX) 75 MG tablet, TAKE 1 TABLET EVERY DAY, Disp: 90 tablet, Rfl: 3 .  ezetimibe (ZETIA) 10 MG tablet, TAKE 1 TABLET (10 MG TOTAL) BY MOUTH DAILY., Disp: 90 tablet, Rfl: 3 .  metoprolol succinate (TOPROL-XL) 25 MG 24 hr tablet, TAKE 1 TABLET BY MOUTH DAILY WITH OR IMMEDIATLEY FOLLOWING A MEAL, Disp: 90 tablet, Rfl: 3 .  Multiple Vitamin (MULTIVITAMIN) tablet, Take 1 tablet by mouth daily.  , Disp: , Rfl:  .  Omega-3 Fatty Acids (OMEGA-3 FISH OIL) 1200 MG CAPS, Take 1,200 mg by mouth daily. , Disp: , Rfl:  .  omeprazole (PRILOSEC OTC) 20 MG tablet, Take 20 mg by mouth daily., Disp: , Rfl:  .  vitamin B-12 (CYANOCOBALAMIN) 500 MCG tablet, Take 500 mcg by mouth daily., Disp: , Rfl:   Patient Care Team: Malva Limes, MD as PCP - General (Family Medicine) Antonieta Iba, MD as Consulting Physician (Cardiology)     Objective:   Vitals: BP 140/74 (BP Location: Right Arm, Patient Position: Sitting, Cuff  Size: Large)   Pulse 62   Temp 98 F (36.7 C) (Oral)   Resp 16   Ht 5' 9.5" (1.765 m)   Wt 227 lb (103 kg)   SpO2 97%   BMI 33.04 kg/m   Physical Exam   General Appearance:    Alert, cooperative, no distress, appears stated age  Head:    Normocephalic, without obvious abnormality, atraumatic  Eyes:    PERRL, conjunctiva/corneas clear, EOM's intact, fundi    benign, both eyes       Ears:    Normal TM's and external ear canals, both ears  Nose:   Nares normal, septum midline,  mucosa normal, no drainage   or sinus tenderness  Throat:   Lips, mucosa, and tongue normal; teeth and gums normal  Neck:   Supple, symmetrical, trachea midline, no adenopathy;       thyroid:  No enlargement/tenderness/nodules; no carotid   bruit or JVD  Back:     Symmetric, no curvature, ROM normal, no CVA tenderness  Lungs:     Clear to auscultation bilaterally, respirations unlabored  Chest wall:    No tenderness or deformity  Heart:    Regular rate and rhythm, S1 and S2 normal, no murmur, rub   or gallop  Abdomen:     Soft, non-tender, bowel sounds active all four quadrants,    no masses, no organomegaly  Genitalia:    deferred  Rectal:    deferred  Extremities:   Extremities normal, atraumatic, no cyanosis or edema  Pulses:   2+ and symmetric all extremities  Skin:   Skin color, texture, turgor normal, no rashes or lesions  Lymph nodes:   Cervical, supraclavicular, and axillary nodes normal  Neurologic:   CNII-XII intact. Normal strength, sensation and reflexes      throughout    Activities of Daily Living In your present state of health, do you have any difficulty performing the following activities: 10/02/2017  Hearing? N  Vision? N  Difficulty concentrating or making decisions? N  Walking or climbing stairs? N  Dressing or bathing? N  Doing errands, shopping? N  Some recent data might be hidden    Fall Risk Assessment Fall Risk  10/02/2017 09/29/2016  Falls in the past year? No No       Depression Screen PHQ 2/9 Scores 10/02/2017 09/29/2016 06/28/2012  PHQ - 2 Score 0 0 2  PHQ- 9 Score 0 - 4    Cognitive Testing - 6-CIT  Correct? Score   What year is it? yes 0 0 or 4  What month is it? yes 0 0 or 3  Memorize:    Floyde Parkins,  44,  High [address]  [place],      What time is it? (within 1 hour) yes 0 0 or 3  Count backwards from 20 yes 0 0, 2, or 4  Name the months of the year yes 0 0, 2, or 4  Repeat name & address above yes 0 0, 2, 4, 6, 8, or 10       TOTAL SCORE  0/28   Interpretation:  Normal  Normal (0-7) Abnormal (8-28)    Audit-C Alcohol Use Screening  Question Answer Points  How often do you have alcoholic drink? 1 or more times weekly 2  On days you do drink alcohol, how many drinks do you typically consume? 1 to 2 1  How oftey will you drink 6 or more in a total? never 0  Total Score:  3   A score of 3 or more in women, and 4 or more in men indicates increased risk for alcohol abuse, EXCEPT if all of the points are from question 1.     Assessment & Plan:    Annual physical  Reviewed patient's Family Medical History Reviewed and updated list of patient's medical providers Assessment of cognitive impairment was done Assessed patient's functional ability Established a written schedule for health screening services Health Risk Assessent Completed and Reviewed  Exercise Activities and Dietary recommendations Goals    None      Immunization History  Administered Date(s) Administered  . Influenza Split 06/28/2012  .  Influenza,inj,Quad PF,6+ Mos 06/28/2013, 07/03/2014, 03/13/2016, 03/24/2017  . Pneumococcal Conjugate-13 07/03/2014  . Pneumococcal Polysaccharide-23 06/28/2013  . Td 05/26/2005  . Tdap 09/24/2015  . Zoster 09/24/2015    Health Maintenance  Topic Date Due  . COLONOSCOPY  02/08/1998  . INFLUENZA VACCINE  12/24/2017  . TETANUS/TDAP  09/23/2025  . Hepatitis C Screening  Completed  . PNA vac Low Risk Adult  Completed      Discussed health benefits of physical activity, and encouraged him to engage in regular exercise appropriate for his age and condition.    --------------------------------------------------------------------------  1. Annual physical exam   2. Bilateral carotid artery stenosis   3. HYPERTENSION, BENIGN Well controlled.  Continue current medications.    4. Pure hypercholesterolemia He is tolerating atorvastatin well with no adverse effects.   - Comprehensive metabolic panel - Lipid panel  5. Low vitamin B12 level - Vitamin B12  6. Colon cancer screening  - Cologuard  7. Prostate cancer screening  - PSA  8. Prediabetes  - Hemoglobin A1c  9. Paresthesia   10. Gastroesophageal reflux disease without esophagitis Change from OTC to prescription  - omeprazole (PRILOSEC) 20 MG capsule; Take 1 capsule (20 mg total) by mouth daily.  Dispense: 90 capsule; Refill: 3  11. Stenosis of left carotid artery Due for follow up - US Carotid Bilateral; Future next month.   Mila Merry, MD  [facility]

## 2017-10-05 ENCOUNTER — Encounter: Payer: Medicare HMO | Admitting: Family Medicine

## 2017-10-05 ENCOUNTER — Telehealth: Payer: Self-pay | Admitting: Family Medicine

## 2017-10-05 DIAGNOSIS — E7800 Pure hypercholesterolemia, unspecified: Secondary | ICD-10-CM | POA: Diagnosis not present

## 2017-10-05 DIAGNOSIS — E538 Deficiency of other specified B group vitamins: Secondary | ICD-10-CM | POA: Diagnosis not present

## 2017-10-05 DIAGNOSIS — R7303 Prediabetes: Secondary | ICD-10-CM | POA: Diagnosis not present

## 2017-10-05 DIAGNOSIS — Z125 Encounter for screening for malignant neoplasm of prostate: Secondary | ICD-10-CM | POA: Diagnosis not present

## 2017-10-05 NOTE — Telephone Encounter (Signed)
Order for cologuard faxed to [facility] °

## 2017-10-06 LAB — COMPREHENSIVE METABOLIC PANEL
A/G RATIO: 1.7 (ref 1.2–2.2)
ALK PHOS: 60 IU/L (ref 39–117)
ALT: 31 IU/L (ref 0–44)
AST: 22 IU/L (ref 0–40)
Albumin: 4.3 g/dL (ref 3.6–4.8)
BUN / CREAT RATIO: 13 (ref 10–24)
BUN: 12 mg/dL (ref 8–27)
Bilirubin Total: 0.4 mg/dL (ref 0.0–1.2)
CO2: 25 mmol/L (ref 20–29)
Calcium: 9.1 mg/dL (ref 8.6–10.2)
Chloride: 102 mmol/L (ref 96–106)
Creatinine, Ser: 0.93 mg/dL (ref 0.76–1.27)
GFR calc Af Amer: 96 mL/min/{1.73_m2} (ref >59)
GFR, EST NON AFRICAN AMERICAN: 83 mL/min/{1.73_m2} (ref >59)
GLOBULIN, TOTAL: 2.6 g/dL (ref 1.5–4.5)
Glucose: 104 mg/dL — ABNORMAL HIGH (ref 65–99)
POTASSIUM: 4.4 mmol/L (ref 3.5–5.2)
SODIUM: 143 mmol/L (ref 134–144)
Total Protein: 6.9 g/dL (ref 6.0–8.5)

## 2017-10-06 LAB — LIPID PANEL
CHOL/HDL RATIO: 3.3 ratio (ref 0.0–5.0)
CHOLESTEROL TOTAL: 101 mg/dL (ref 100–199)
HDL: 31 mg/dL — ABNORMAL LOW (ref >39)
LDL Calculated: 42 mg/dL (ref 0–99)
TRIGLYCERIDES: 141 mg/dL (ref 0–149)
VLDL Cholesterol Cal: 28 mg/dL (ref 5–40)

## 2017-10-06 LAB — VITAMIN B12: Vitamin B-12: 1103 pg/mL (ref 232–1245)

## 2017-10-06 LAB — PSA: Prostate Specific Ag, Serum: 2.8 ng/mL (ref 0.0–4.0)

## 2017-10-06 LAB — HEMOGLOBIN A1C
Est. average glucose Bld gHb Est-mCnc: 117 mg/dL
Hgb A1c MFr Bld: 5.7 % — ABNORMAL HIGH (ref 4.8–5.6)

## 2017-10-09 ENCOUNTER — Ambulatory Visit
Admission: RE | Admit: 2017-10-09 | Discharge: 2017-10-09 | Disposition: A | Discharge status: Home / Self Care | Payer: Medicare HMO | Source: Ambulatory Visit | Attending: Family Medicine | Admitting: Family Medicine

## 2017-10-09 DIAGNOSIS — I6523 Occlusion and stenosis of bilateral carotid arteries: Secondary | ICD-10-CM | POA: Diagnosis not present

## 2017-10-09 DIAGNOSIS — I6522 Occlusion and stenosis of left carotid artery: Secondary | ICD-10-CM | POA: Diagnosis present

## 2017-10-13 ENCOUNTER — Telehealth: Payer: Self-pay | Admitting: *Deleted

## 2017-10-13 NOTE — Telephone Encounter (Signed)
LMOVM for pt to return call 

## 2017-10-13 NOTE — Telephone Encounter (Signed)
Pt returned call

## 2017-10-13 NOTE — Telephone Encounter (Signed)
-----   Message from Malva Limes, MD sent at 10/12/2017  9:36 PM EDT ----- Ultrasound shows mild plaque formation of left carotid artery. Stable since last year. No specific follow up needed. Just need to stay on statin to prevent plaques from getting larger.

## 2017-10-14 DIAGNOSIS — Z1211 Encounter for screening for malignant neoplasm of colon: Secondary | ICD-10-CM | POA: Diagnosis not present

## 2017-10-14 NOTE — Telephone Encounter (Signed)
Patient was advised of results. Expressed understanding.  

## 2017-10-15 LAB — COLOGUARD: Cologuard: POSITIVE

## 2017-10-22 ENCOUNTER — Encounter: Payer: Medicare HMO | Admitting: Family Medicine

## 2017-10-30 ENCOUNTER — Encounter: Payer: Self-pay | Admitting: Family Medicine

## 2017-10-30 ENCOUNTER — Telehealth: Payer: Self-pay | Admitting: *Deleted

## 2017-10-30 DIAGNOSIS — R195 Other fecal abnormalities: Secondary | ICD-10-CM

## 2017-10-30 NOTE — Telephone Encounter (Signed)
Patient was notified of results. Expressed understanding.  

## 2017-10-30 NOTE — Telephone Encounter (Signed)
Received fax showing positive Cologuard.

## 2017-10-30 NOTE — Telephone Encounter (Signed)
Please advise of positive cologuard. Referral entered to gi.

## 2017-11-16 ENCOUNTER — Telehealth: Payer: Self-pay | Admitting: Gastroenterology

## 2017-11-16 NOTE — Telephone Encounter (Signed)
Patient called and is ready to schedule procedure. 

## 2017-11-17 ENCOUNTER — Other Ambulatory Visit: Payer: Self-pay

## 2017-11-17 DIAGNOSIS — R195 Other fecal abnormalities: Secondary | ICD-10-CM

## 2017-11-24 ENCOUNTER — Other Ambulatory Visit: Payer: Self-pay

## 2017-11-30 ENCOUNTER — Encounter: Payer: Self-pay | Admitting: *Deleted

## 2017-12-01 ENCOUNTER — Encounter: Payer: Self-pay | Admitting: Anesthesiology

## 2017-12-01 ENCOUNTER — Ambulatory Visit
Admission: RE | Admit: 2017-12-01 | Discharge: 2017-12-01 | Disposition: A | Discharge status: Home / Self Care | Payer: Medicare HMO | Source: Ambulatory Visit | Attending: Gastroenterology | Admitting: Gastroenterology

## 2017-12-01 ENCOUNTER — Ambulatory Visit: Payer: Medicare HMO | Admitting: Anesthesiology

## 2017-12-01 ENCOUNTER — Encounter
Admission: RE | Disposition: A | Discharge status: Home / Self Care | Payer: Self-pay | Source: Ambulatory Visit | Attending: Gastroenterology

## 2017-12-01 DIAGNOSIS — Z79899 Other long term (current) drug therapy: Secondary | ICD-10-CM | POA: Insufficient documentation

## 2017-12-01 DIAGNOSIS — Z951 Presence of aortocoronary bypass graft: Secondary | ICD-10-CM | POA: Diagnosis not present

## 2017-12-01 DIAGNOSIS — I2510 Atherosclerotic heart disease of native coronary artery without angina pectoris: Secondary | ICD-10-CM | POA: Insufficient documentation

## 2017-12-01 DIAGNOSIS — Z8249 Family history of ischemic heart disease and other diseases of the circulatory system: Secondary | ICD-10-CM | POA: Insufficient documentation

## 2017-12-01 DIAGNOSIS — Z7902 Long term (current) use of antithrombotics/antiplatelets: Secondary | ICD-10-CM | POA: Diagnosis not present

## 2017-12-01 DIAGNOSIS — Z955 Presence of coronary angioplasty implant and graft: Secondary | ICD-10-CM | POA: Insufficient documentation

## 2017-12-01 DIAGNOSIS — Z888 Allergy status to other drugs, medicaments and biological substances status: Secondary | ICD-10-CM | POA: Diagnosis not present

## 2017-12-01 DIAGNOSIS — K640 First degree hemorrhoids: Secondary | ICD-10-CM | POA: Diagnosis not present

## 2017-12-01 DIAGNOSIS — I739 Peripheral vascular disease, unspecified: Secondary | ICD-10-CM | POA: Diagnosis not present

## 2017-12-01 DIAGNOSIS — Z87891 Personal history of nicotine dependence: Secondary | ICD-10-CM | POA: Diagnosis not present

## 2017-12-01 DIAGNOSIS — K5730 Diverticulosis of large intestine without perforation or abscess without bleeding: Secondary | ICD-10-CM | POA: Diagnosis not present

## 2017-12-01 DIAGNOSIS — I10 Essential (primary) hypertension: Secondary | ICD-10-CM | POA: Diagnosis not present

## 2017-12-01 DIAGNOSIS — Z7982 Long term (current) use of aspirin: Secondary | ICD-10-CM | POA: Insufficient documentation

## 2017-12-01 DIAGNOSIS — R195 Other fecal abnormalities: Secondary | ICD-10-CM | POA: Diagnosis not present

## 2017-12-01 DIAGNOSIS — K5790 Diverticulosis of intestine, part unspecified, without perforation or abscess without bleeding: Secondary | ICD-10-CM | POA: Diagnosis not present

## 2017-12-01 DIAGNOSIS — E785 Hyperlipidemia, unspecified: Secondary | ICD-10-CM | POA: Diagnosis not present

## 2017-12-01 HISTORY — PX: COLONOSCOPY WITH PROPOFOL: SHX5780

## 2017-12-01 SURGERY — COLONOSCOPY WITH PROPOFOL
Anesthesia: General

## 2017-12-01 MED ORDER — SODIUM CHLORIDE 0.9 % IV SOLN: INTRAVENOUS | Status: DC

## 2017-12-01 MED ORDER — PROPOFOL 10 MG/ML IV BOLUS
INTRAVENOUS | Status: DC | PRN
  Administered 2017-12-01: 160 mg via INTRAVENOUS

## 2017-12-01 MED ORDER — SODIUM CHLORIDE 0.9 % IV SOLN
INTRAVENOUS | Status: DC
  Administered 2017-12-01: 1000 mL via INTRAVENOUS

## 2017-12-01 MED ORDER — PROPOFOL 500 MG/50ML IV EMUL
INTRAVENOUS | Status: DC | PRN
  Administered 2017-12-01: 100 ug/kg/min via INTRAVENOUS

## 2017-12-01 NOTE — Anesthesia Postprocedure Evaluation (Signed)
Anesthesia Post Note  Patient: Leonard Hughes  Procedure(s) Performed: COLONOSCOPY WITH PROPOFOL (N/A )  Patient location during evaluation: Endoscopy Anesthesia Type: General Level of consciousness: awake and alert Pain management: pain level controlled Vital Signs Assessment: post-procedure vital signs reviewed and stable Respiratory status: spontaneous breathing, nonlabored ventilation, respiratory function stable and patient connected to nasal cannula oxygen Cardiovascular status: blood pressure returned to baseline and stable Postop Assessment: no apparent nausea or vomiting Anesthetic complications: no     Last Vitals:  Vitals:   12/01/17 0913 12/01/17 1025  BP: (!) 178/82 (!) 132/56  Pulse: 80   Resp: 18   Temp: (!) 35.8 C (!) 36.1 C  SpO2: 94%     Last Pain:  Vitals:   12/01/17 1055  TempSrc:   PainSc: 0-No pain                 Jermaine Tholl S

## 2017-12-01 NOTE — Anesthesia Procedure Notes (Signed)
Date/Time: 12/01/2017 10:10 AM Performed by: Milagros Reapola, Zacharia Sowles H, CRNA Pre-anesthesia Checklist: Patient identified, Emergency Drugs available, Suction available, Patient being monitored and Timeout performed Patient Re-evaluated:Patient Re-evaluated prior to induction Oxygen Delivery Method: Nasal cannula

## 2017-12-01 NOTE — Anesthesia Procedure Notes (Signed)
Performed by: Michole Lecuyer H, CRNA       

## 2017-12-01 NOTE — Anesthesia Preprocedure Evaluation (Signed)
Anesthesia Evaluation  Patient identified by MRN, date of birth, ID band Patient awake    Reviewed: Allergy & Precautions, NPO status , Patient's Chart, lab work & pertinent test results, reviewed documented beta blocker date and time   Airway Mallampati: II  TM Distance: >3 FB     Dental  (+) Chipped   Pulmonary former smoker,           Cardiovascular hypertension, Pt. on medications and Pt. on home beta blockers + CAD, + Cardiac Stents, + CABG and + Peripheral Vascular Disease       Neuro/Psych    GI/Hepatic   Endo/Other    Renal/GU      Musculoskeletal   Abdominal   Peds  Hematology   Anesthesia Other Findings   Reproductive/Obstetrics                             Anesthesia Physical Anesthesia Plan  ASA: III  Anesthesia Plan: General   Post-op Pain Management:    Induction: Intravenous  PONV Risk Score and Plan:   Airway Management Planned:   Additional Equipment:   Intra-op Plan:   Post-operative Plan:   Informed Consent: I have reviewed the patients History and Physical, chart, labs and discussed the procedure including the risks, benefits and alternatives for the proposed anesthesia with the patient or authorized representative who has indicated his/her understanding and acceptance.     Plan Discussed with: CRNA  Anesthesia Plan Comments:         Anesthesia Quick Evaluation

## 2017-12-01 NOTE — H&P (Signed)
Leonard Miniumarren Long Brimage, MD [facility] [address] [place], [place] [address] Phone:[phone] Fax : [phone]  Primary Care Physician:  Leonard LimesFisher, Donald E, MD Primary Gastroenterologist:  Dr. Servando SnareWohl  Pre-Procedure History & Physical: HPI:  Leonard Hughes is a 72 y.o. male is here for an colonoscopy.   Past Medical History:  Diagnosis Date  . Coronary artery disease 2007   CABG 2v Dorris Fetch([facility])  . History of chicken pox     Past Surgical History:  Procedure Laterality Date  . CORONARY ANGIOPLASTY    . CORONARY ARTERY BYPASS GRAFT  2007   2 vessel, [facility]  . PERCUTANEOUS CORONARY STENT INTERVENTION (PCI-S)  2001   mid L circ  . TONSILLECTOMY      Prior to Admission medications   Medication Sig Start Date End Date Taking? Authorizing Provider  aspirin 81 MG EC tablet Take 81 mg by mouth daily.     Yes [provider]  atorvastatin (LIPITOR) 40 MG tablet TAKE 1 TABLET EVERY DAY 09/07/17  Yes Gollan, Tollie Pizzaimothy J, MD  clopidogrel (PLAVIX) 75 MG tablet TAKE 1 TABLET EVERY DAY 09/07/17  Yes Gollan, Tollie Pizzaimothy J, MD  ezetimibe (ZETIA) 10 MG tablet TAKE 1 TABLET (10 MG TOTAL) BY MOUTH DAILY. 09/07/17  Yes Gollan, Tollie Pizzaimothy J, MD  metoprolol succinate (TOPROL-XL) 25 MG 24 hr tablet TAKE 1 TABLET BY MOUTH DAILY WITH OR IMMEDIATLEY FOLLOWING A MEAL 09/07/17  Yes Gollan, Tollie Pizzaimothy J, MD  Multiple Vitamin (MULTIVITAMIN) tablet Take 1 tablet by mouth daily.     Yes [provider]  Omega-3 Fatty Acids (OMEGA-3 FISH OIL) 1200 MG CAPS Take 1,200 mg by mouth daily.    Yes [provider]  omeprazole (PRILOSEC OTC) 20 MG tablet Take 20 mg by mouth daily.   Yes [provider]  vitamin B-12 (CYANOCOBALAMIN) 500 MCG tablet Take 500 mcg by mouth daily.   Yes [provider]  omeprazole (PRILOSEC) 20 MG capsule Take 1 capsule (20 mg total) by mouth daily. 10/02/17   Leonard LimesFisher, Donald E, MD    Allergies as of 11/17/2017 - Review Complete 06/10/2017  Allergen  Reaction Noted  . Crestor [rosuvastatin] Other (See Comments) 06/28/2013    Family History  Problem Relation Age of Onset  . Heart disease Mother        ICD and stents  . Stroke Mother        possibly  . CAD Father [id number]       MI  . CAD Sister        triple bypass  . CAD Brother [id number]  . Diabetes Neg Hx   . Cancer Neg Hx     Social History   Socioeconomic History  . Marital status: Married    Spouse name: Not on file  . Number of children: Not on file  . Years of education: Not on file  . Highest education level: Not on file  Occupational History  . Not on file  Social Needs  . Financial resource strain: Not on file  . Food insecurity:    Worry: Not on file    Inability: Not on file  . Transportation needs:    Medical: Not on file    Non-medical: Not on file  Tobacco Use  . Smoking status: Former Smoker    Packs/day: 1.00    Years: 3.00    Pack years: 3.00    Types: Cigarettes    Last attempt to quit: 08/26/1979    Years since quitting:  38.2  . Smokeless tobacco: Never Used  Substance and Sexual Activity  . Alcohol use: Yes    Alcohol/week: 0.6 oz    Types: 1 Standard drinks or equivalent per week    Comment: Socially  . Drug use: No  . Sexual activity: Not on file  Lifestyle  . Physical activity:    Days per week: Not on file    Minutes per session: Not on file  . Stress: Not on file  Relationships  . Social connections:    Talks on phone: Not on file    Gets together: Not on file    Attends religious service: Not on file    Active member of club or organization: Not on file    Attends meetings of clubs or organizations: Not on file    Relationship status: Not on file  . Intimate partner violence:    Fear of current or ex partner: Not on file    Emotionally abused: Not on file    Physically abused: Not on file    Forced sexual activity: Not on file  Other Topics Concern  . Not on file  Social History Narrative   Caffeine: 2-3 cups coffee/day   Lives  with wife, mother in law, cats and dog.  grown daughter   Occupation: Airline pilot, Scientist, water quality   Edu: BA at [employer]   Activity: walking 2 mi 5d/wk   Diet: good water, fruits/vegetables daily    Review of Systems: See HPI, otherwise negative ROS  Physical Exam: BP (!) 178/82   Pulse 80   Temp (!) 96.4 F (35.8 C) (Tympanic)   Resp 18   Ht 5\' 11"  (1.803 m)   Wt 220 lb (99.8 kg)   SpO2 94%   BMI 30.68 kg/m  General:   Alert,  pleasant and cooperative in NAD Head:  Normocephalic and atraumatic. Neck:  Supple; no masses or thyromegaly. Lungs:  Clear throughout to auscultation.    Heart:  Regular rate and rhythm. Abdomen:  Soft, nontender and nondistended. Normal bowel sounds, without guarding, and without rebound.   Neurologic:  Alert and  oriented x4;  grossly normal neurologically.  Impression/Plan: Leonard Hughes is here for an colonoscopy to be performed for positive cologuard  Risks, benefits, limitations, and alternatives regarding  colonoscopy have been reviewed with the patient.  Questions have been answered.  All parties agreeable.   Leonard Minium, MD  12/01/2017, 9:46 AM

## 2017-12-01 NOTE — Op Note (Signed)
[facility]astroenterology Patient Name: Leonard Hughes Procedure Date: 12/01/2017 9:36 AM MRN: [id number] Account #: [id number] Date of Birth: 1946/02/23 Admit Type: Outpatient Age: 72 Room: [facility] ENDO ROOM 4 Gender: Male Note Status: Finalized Procedure:            Colonoscopy Indications:          Positive Cologuard test Providers:            Midge Minium MD, MD Referring MD:         Demetrios Isaacs. Sherrie Mustache, MD (Referring MD) Medicines:            Propofol per Anesthesia Complications:        No immediate complications. Procedure:            Pre-Anesthesia Assessment:                       - Prior to the procedure, a History and Physical was                        performed, and patient medications and allergies were                        reviewed. The patient's tolerance of previous                        anesthesia was also reviewed. The risks and benefits of                        the procedure and the sedation options and risks were                        discussed with the patient. All questions were                        answered, and informed consent was obtained. Prior                        Anticoagulants: The patient has taken no previous                        anticoagulant or antiplatelet agents. ASA Grade                        Assessment: II - A patient with mild systemic disease.                        After reviewing the risks and benefits, the patient was                        deemed in satisfactory condition to undergo the                        procedure.                       After obtaining informed consent, the colonoscope was                        passed under direct vision. Throughout the procedure,  the patient's blood pressure, pulse, and oxygen                        saturations were monitored continuously. The                        Colonoscope was introduced through the anus and                        advanced to  the the cecum, identified by appendiceal                        orifice and ileocecal valve. The colonoscopy was                        performed without difficulty. The patient tolerated the                        procedure well. The quality of the bowel preparation                        was excellent. Findings:      The perianal and digital rectal examinations were normal.      A few small-mouthed diverticula were found in the sigmoid colon.      Non-bleeding internal hemorrhoids were found during retroflexion. The       hemorrhoids were Grade I (internal hemorrhoids that do not prolapse). Impression:           - Diverticulosis in the sigmoid colon.                       - Non-bleeding internal hemorrhoids.                       - No specimens collected. Recommendation:       - Discharge patient to home.                       - Resume previous diet.                       - Continue present medications.                       - Repeat colonoscopy in 10 years for screening unless                        any change in family history or lower GI problems. Procedure Code(s):    --- Professional ---                       [phone], Colonoscopy, flexible; diagnostic, including                        collection of specimen(s) by brushing or washing, when                        performed (separate procedure) Diagnosis Code(s):    --- Professional ---                       R19.5, Other fecal abnormalities CPT copyright 2017 American Medical Association. All  rights reserved. The codes documented in this report are preliminary and upon coder review may  be revised to meet current compliance requirements. Midge Minium MD, MD 12/01/2017 10:20:47 AM This report has been signed electronically. Number of Addenda: 0 Note Initiated On: 12/01/2017 9:36 AM Scope Withdrawal Time: 0 hours 8 minutes 25 seconds  Total Procedure Duration: 0 hours 10 minutes 24 seconds       [facility]

## 2017-12-01 NOTE — Anesthesia Post-op Follow-up Note (Signed)
Anesthesia QCDR form completed.        

## 2017-12-01 NOTE — Transfer of Care (Signed)
Immediate Anesthesia Transfer of Care Note  Patient: Leonard LoseMichael A Hughes  Procedure(s) Performed: COLONOSCOPY WITH PROPOFOL (N/A )  Patient Location: PACU and Endoscopy Unit  Anesthesia Type:General  Level of Consciousness: awake  Airway & Oxygen Therapy: Patient Spontanous Breathing and Patient connected to nasal cannula oxygen  Post-op Assessment: Report given to RN and Post -op Vital signs reviewed and stable  Post vital signs: Reviewed and stable  Last Vitals:  Vitals Value Taken Time  BP 132/56 12/01/2017 10:27 AM  Temp 36.1 C 12/01/2017 10:25 AM  Pulse 60 12/01/2017 10:29 AM  Resp 21 12/01/2017 10:29 AM  SpO2 96 % 12/01/2017 10:29 AM  Vitals shown include unvalidated device data.  Last Pain:  Vitals:   12/01/17 1025  TempSrc: Tympanic  PainSc:          Complications: No apparent anesthesia complications

## 2017-12-02 ENCOUNTER — Encounter: Payer: Self-pay | Admitting: Gastroenterology

## 2017-12-10 ENCOUNTER — Ambulatory Visit (INDEPENDENT_AMBULATORY_CARE_PROVIDER_SITE_OTHER): Payer: Medicare HMO | Admitting: Family Medicine

## 2017-12-10 ENCOUNTER — Encounter: Payer: Self-pay | Admitting: Family Medicine

## 2017-12-10 VITALS — BP 140/84 | HR 67 | Temp 98.5°F | Resp 16 | Wt 230.0 lb

## 2017-12-10 DIAGNOSIS — M533 Sacrococcygeal disorders, not elsewhere classified: Secondary | ICD-10-CM | POA: Diagnosis not present

## 2017-12-10 DIAGNOSIS — M545 Low back pain, unspecified: Secondary | ICD-10-CM

## 2017-12-10 LAB — POCT URINALYSIS DIPSTICK
Bilirubin, UA: NEGATIVE
Glucose, UA: NEGATIVE
Ketones, UA: NEGATIVE
Leukocytes, UA: NEGATIVE
Nitrite, UA: NEGATIVE
PH UA: 7 (ref 5.0–8.0)
PROTEIN UA: NEGATIVE
RBC UA: NEGATIVE
Spec Grav, UA: 1.01 (ref 1.010–1.025)
UROBILINOGEN UA: 0.2 U/dL

## 2017-12-10 MED ORDER — PREDNISONE 10 MG PO TABS: ORAL_TABLET | ORAL | 0 refills | Status: AC

## 2017-12-10 NOTE — Progress Notes (Signed)
Patient: Leonard Hughes Male    DOB: Aug 21, 1945   72 y.o.   MRN: [id number] Visit Date: 12/10/2017  Today's Provider: Mila Merry, MD   Chief Complaint  Patient presents with  . Back Pain   Subjective:    Back Pain  This is a new problem. Episode onset: 2 days ago with first step after getting out of bed. The problem has been gradually worsening since onset. The pain is present in the lumbar spine (left side). The symptoms are aggravated by sitting. Pertinent negatives include no abdominal pain, chest pain, dysuria, fever, headaches, leg pain, numbness, tingling or weakness. Treatments tried: laying flat on the floor. The treatment provided significant relief.       Allergies  Allergen Reactions  . Crestor [Rosuvastatin] Other (See Comments)    myalgias     Current Outpatient Medications:  .  aspirin 81 MG EC tablet, Take 81 mg by mouth daily.  , Disp: , Rfl:  .  atorvastatin (LIPITOR) 40 MG tablet, TAKE 1 TABLET EVERY DAY, Disp: 90 tablet, Rfl: 3 .  clopidogrel (PLAVIX) 75 MG tablet, TAKE 1 TABLET EVERY DAY, Disp: 90 tablet, Rfl: 3 .  ezetimibe (ZETIA) 10 MG tablet, TAKE 1 TABLET (10 MG TOTAL) BY MOUTH DAILY., Disp: 90 tablet, Rfl: 3 .  metoprolol succinate (TOPROL-XL) 25 MG 24 hr tablet, TAKE 1 TABLET BY MOUTH DAILY WITH OR IMMEDIATLEY FOLLOWING A MEAL, Disp: 90 tablet, Rfl: 3 .  Multiple Vitamin (MULTIVITAMIN) tablet, Take 1 tablet by mouth daily.  , Disp: , Rfl:  .  Omega-3 Fatty Acids (OMEGA-3 FISH OIL) 1200 MG CAPS, Take 1,200 mg by mouth daily. , Disp: , Rfl:  .  omeprazole (PRILOSEC OTC) 20 MG tablet, Take 20 mg by mouth daily., Disp: , Rfl:  .  omeprazole (PRILOSEC) 20 MG capsule, Take 1 capsule (20 mg total) by mouth daily., Disp: 90 capsule, Rfl: 3 .  vitamin B-12 (CYANOCOBALAMIN) 500 MCG tablet, Take 500 mcg by mouth daily., Disp: , Rfl:   Review of Systems  Constitutional: Negative for appetite change, chills and fever.  Respiratory: Negative for chest  tightness, shortness of breath and wheezing.   Cardiovascular: Negative for chest pain and palpitations.  Gastrointestinal: Negative for abdominal pain, nausea and vomiting.  Genitourinary: Negative for dysuria.  Musculoskeletal: Positive for back pain.  Neurological: Negative for tingling, weakness, numbness and headaches.    Social History   Tobacco Use  . Smoking status: Former Smoker    Packs/day: 1.00    Years: 3.00    Pack years: 3.00    Types: Cigarettes    Last attempt to quit: 08/26/1979    Years since quitting: 38.3  . Smokeless tobacco: Never Used  Substance Use Topics  . Alcohol use: Yes    Alcohol/week: 0.6 oz    Types: 1 Standard drinks or equivalent per week    Comment: Socially   Objective:   BP 140/84 (BP Location: Left Arm, Patient Position: Sitting, Cuff Size: Large)   Pulse 67   Temp 98.5 F (36.9 C) (Oral)   Resp 16   Wt 230 lb (104.3 kg)   SpO2 96% Comment: room air  BMI 32.08 kg/m     Physical Exam  General appearance: alert, well developed, well nourished, cooperative and in no distress Head: Normocephalic, without obvious abnormality, atraumatic Respiratory: Respirations even and unlabored, normal respiratory rate MS: Tender over left SI joint, no spine or muscle tenderness. Normal LE strength. Marland Kitchen  Assessment & Plan:     1. Acute left-sided low back pain without sciatica  - POCT Urinalysis Dipstick  2. Sacroiliac dysfunction  - predniSONE (DELTASONE) 10 MG tablet; 6 tablets for 1 day, then 5 for 1 day, then 4 for 1 day, then 3 for 1 day, then 2 for 1 day then 1 for 1 day.  Dispense: 21 tablet; Refill: 0 Call if symptoms change or if not rapidly improving.  Consider orthopedics referral.           Mila Merryonald Jarold Macomber, MD  [facility]

## 2017-12-10 NOTE — Patient Instructions (Signed)
Sacroiliac Joint Dysfunction Sacroiliac joint dysfunction is a condition that causes inflammation on one or both sides of the sacroiliac (SI) joint. The SI joint connects the lower part of the spine (sacrum) with the two upper portions of the pelvis (ilium). This condition causes deep aching or burning pain in the low back. In some cases, the pain may also spread into one or both buttocks or hips or spread down the legs. What are the causes? This condition may be caused by:  Pregnancy. During pregnancy, extra stress is put on the SI joints because the pelvis widens.  Injury, such as: ? Car accidents. ? Sport-related injuries. ? Work-related injuries.  Having one leg that is shorter than the other.  Conditions that affect the joints, such as: ? Rheumatoid arthritis. ? Gout. ? Psoriatic arthritis. ? Joint infection (septic arthritis).  Sometimes, the cause of SI joint dysfunction is not known. What are the signs or symptoms? Symptoms of this condition include:  Aching or burning pain in the lower back. The pain may also spread to other areas, such as: ? Buttocks. ? Groin. ? Thighs and legs.  Muscle spasms in or around the painful areas.  Increased pain when standing, walking, running, stair climbing, bending, or lifting.  How is this diagnosed? Your health care provider will do a physical exam and take your medical history. During the exam, the health care provider may move one or both of your legs to different positions to check for pain. Various tests may be done to help verify the diagnosis, including:  Imaging tests to look for other causes of pain. These may include: ? MRI. ? CT scan. ? Bone scan.  Diagnostic injection. A numbing medicine is injected into the SI joint using a needle. If the pain is temporarily improved or stopped after the injection, this can indicate that SI joint dysfunction is the problem.  How is this treated? Treatment may vary depending on the  cause and severity of your condition. Treatment options may include:  Applying ice or heat to the lower back area. This can help to reduce pain and muscle spasms.  Medicines to relieve pain or inflammation or to relax the muscles.  Wearing a back brace (sacroiliac brace) to help support the joint while your back is healing.  Physical therapy to increase muscle strength around the joint and flexibility at the joint. This may also involve learning proper body positions and ways of moving to relieve stress on the joint.  Direct manipulation of the SI joint.  Injections of steroid medicine into the joint in order to reduce pain and swelling.  Radiofrequency ablation to burn away nerves that are carrying pain messages from the joint.  Use of a device that provides electrical stimulation in order to reduce pain at the joint.  Surgery to put in screws and plates that limit or prevent joint motion. This is rare.  Follow these instructions at home:  Rest as needed. Limit your activities as directed by your health care provider.  Take medicines only as directed by your health care provider.  If directed, apply ice to the affected area: ? Put ice in a plastic bag. ? Place a towel between your skin and the bag. ? Leave the ice on for 20 minutes, 2-3 times per day.  Use a heating pad or a moist heat pack as directed by your health care provider.  Exercise as directed by your health care provider or physical therapist.  Keep all follow-up visits   as directed by your health care provider. This is important. Contact a health care provider if:  Your pain is not controlled with medicine.  You have a fever.  You have increasingly severe pain. Get help right away if:  You have weakness, numbness, or tingling in your legs or feet.  You lose control of your bladder or bowel. This information is not intended to replace advice given to you by your health care provider. Make sure you discuss  any questions you have with your health care provider. Document Released: 08/08/2008 Document Revised: 10/18/2015 Document Reviewed: 01/17/2014 Elsevier Interactive Patient Education  2018 [employer]  

## 2017-12-18 ENCOUNTER — Telehealth: Payer: Self-pay

## 2017-12-18 DIAGNOSIS — M545 Low back pain, unspecified: Secondary | ICD-10-CM

## 2017-12-18 NOTE — Telephone Encounter (Signed)
If he improved briefly on prednisone we could send another round. If prednisone made no difference then he will need referral to orthopedist.

## 2017-12-18 NOTE — Telephone Encounter (Signed)
Patient's wife is calling saying that the patient has finished all the prednisone and his back is still hurting. She is wanting to know if Dr. Sherrie MustacheFisher had any other recommendations for his symptoms. The prednisone helped, but once he stopped the pain returned.   Patient uses [facility] [address] Please contact wife on cell number. Thanks!

## 2017-12-18 NOTE — Telephone Encounter (Signed)
Patient's wife Leonard Hughes was advised. Leonard Hughes stated prednisone did not help patient with pain. Patient would like to proceed with referral to orthopedics. Referral in Epic.

## 2017-12-20 ENCOUNTER — Emergency Department: Payer: Medicare HMO

## 2017-12-20 ENCOUNTER — Emergency Department
Admission: EM | Admit: 2017-12-20 | Discharge: 2017-12-20 | Disposition: A | Discharge status: Home / Self Care | Payer: Medicare HMO | Attending: Emergency Medicine | Admitting: Emergency Medicine

## 2017-12-20 ENCOUNTER — Encounter: Payer: Self-pay | Admitting: Emergency Medicine

## 2017-12-20 DIAGNOSIS — Z951 Presence of aortocoronary bypass graft: Secondary | ICD-10-CM | POA: Insufficient documentation

## 2017-12-20 DIAGNOSIS — M545 Low back pain: Secondary | ICD-10-CM | POA: Diagnosis not present

## 2017-12-20 DIAGNOSIS — Z7902 Long term (current) use of antithrombotics/antiplatelets: Secondary | ICD-10-CM | POA: Diagnosis not present

## 2017-12-20 DIAGNOSIS — E785 Hyperlipidemia, unspecified: Secondary | ICD-10-CM | POA: Diagnosis not present

## 2017-12-20 DIAGNOSIS — Z87891 Personal history of nicotine dependence: Secondary | ICD-10-CM | POA: Diagnosis not present

## 2017-12-20 DIAGNOSIS — Z79899 Other long term (current) drug therapy: Secondary | ICD-10-CM | POA: Diagnosis not present

## 2017-12-20 DIAGNOSIS — I2510 Atherosclerotic heart disease of native coronary artery without angina pectoris: Secondary | ICD-10-CM | POA: Insufficient documentation

## 2017-12-20 DIAGNOSIS — M5442 Lumbago with sciatica, left side: Secondary | ICD-10-CM | POA: Diagnosis not present

## 2017-12-20 DIAGNOSIS — I10 Essential (primary) hypertension: Secondary | ICD-10-CM | POA: Diagnosis not present

## 2017-12-20 DIAGNOSIS — Z7982 Long term (current) use of aspirin: Secondary | ICD-10-CM | POA: Diagnosis not present

## 2017-12-20 LAB — URINALYSIS, COMPLETE (UACMP) WITH MICROSCOPIC
Bacteria, UA: NONE SEEN
Bilirubin Urine: NEGATIVE
GLUCOSE, UA: NEGATIVE mg/dL
HGB URINE DIPSTICK: NEGATIVE
Ketones, ur: NEGATIVE mg/dL
LEUKOCYTES UA: NEGATIVE
NITRITE: NEGATIVE
Protein, ur: NEGATIVE mg/dL
Specific Gravity, Urine: 1.016 (ref 1.005–1.030)
Squamous Epithelial / LPF: NONE SEEN (ref 0–5)
pH: 6 (ref 5.0–8.0)

## 2017-12-20 MED ORDER — METHOCARBAMOL 500 MG PO TABS: 500.0000 mg | ORAL_TABLET | Freq: Four times a day (QID) | ORAL | 0 refills | Status: DC

## 2017-12-20 MED ORDER — DIAZEPAM 5 MG PO TABS
5.0000 mg | ORAL_TABLET | Freq: Once | ORAL | Status: AC
  Administered 2017-12-20: 5 mg via ORAL
  Filled 2017-12-20: qty 1

## 2017-12-20 MED ORDER — OXYCODONE-ACETAMINOPHEN 5-325 MG PO TABS: 1.0000 | ORAL_TABLET | Freq: Four times a day (QID) | ORAL | 0 refills | Status: DC | PRN

## 2017-12-20 MED ORDER — OXYCODONE-ACETAMINOPHEN 7.5-325 MG PO TABS
1.0000 | ORAL_TABLET | Freq: Once | ORAL | Status: AC
  Administered 2017-12-20: 1 via ORAL
  Filled 2017-12-20: qty 1

## 2017-12-20 MED ORDER — KETOROLAC TROMETHAMINE 30 MG/ML IJ SOLN
30.0000 mg | Freq: Once | INTRAMUSCULAR | Status: AC
  Administered 2017-12-20: 30 mg via INTRAMUSCULAR
  Filled 2017-12-20: qty 1

## 2017-12-20 NOTE — Discharge Instructions (Signed)
Follow-up with your primary care provider and make arrangements to have facet injections to control back pain.  Begin taking medication only as directed.  Percocet 1 every 6 hours as needed for pain and Robaxin 500 mg every 6 hours for muscle spasms.  Both these medications could cause drowsiness and increase your risk for falling.  You may also use ice or heat to your lower back as needed.

## 2017-12-20 NOTE — ED Provider Notes (Addendum)
[facility] Emergency Department Provider Note  ____________________________________________   First MD Initiated Contact with Patient 12/20/17 0801     (approximate)  I have reviewed the triage vital signs and the nursing notes.   HISTORY  Chief Complaint Back Pain   HPI Leonard Hughes is a 72 y.o. male presents to the ED with complaint of low back pain with radiation into his left hip area.  Patient states that he is already been to his PCP and was treated with prednisone for 6 days which did not relieve his pain.  He states that the pain has continued for approximately 2 weeks.  He denies any trauma.  He states that last night was the worst pain in which he was unable to sleep or get into a comfortable position.  He denies any past history of kidney stones, urinary symptoms, incontinence of bowel or bladder.  Patient has continued to be ambulatory despite his pain.  Currently he rates his pain as 10/10.   Past Medical History:  Diagnosis Date  . Coronary artery disease 2007   CABG 2v Dorris Fetch)  . History of chicken pox     Patient Active Problem List   Diagnosis Date Noted  . Positive colorectal cancer screening using Cologuard test   . Carotid stenosis 11/01/2016  . Low vitamin B12 level 09/25/2015  . Prediabetes 09/24/2015  . Advanced care planning/counseling discussion 07/03/2014  . TMJ pain dysfunction syndrome 06/28/2013  . Paresthesia 06/28/2013  . Healthcare maintenance 06/28/2012  . HLD (hyperlipidemia) 07/03/2009  . HYPERTENSION, BENIGN 07/03/2009  . CAD, ARTERY BYPASS GRAFT 07/03/2009    Past Surgical History:  Procedure Laterality Date  . COLONOSCOPY WITH PROPOFOL N/A 12/01/2017   Procedure: COLONOSCOPY WITH PROPOFOL;  Surgeon: Midge Minium, MD;  Location: [facility] ENDOSCOPY;  Service: Endoscopy;  Laterality: N/A;  . CORONARY ANGIOPLASTY    . CORONARY ARTERY BYPASS GRAFT  2007   2 vessel, [place]  . PERCUTANEOUS CORONARY  STENT INTERVENTION (PCI-S)  2001   mid L circ  . TONSILLECTOMY      Prior to Admission medications   Medication Sig Start Date End Date Taking? Authorizing Provider  aspirin 81 MG EC tablet Take 81 mg by mouth daily.      [provider]  atorvastatin (LIPITOR) 40 MG tablet TAKE 1 TABLET EVERY DAY 09/07/17   Antonieta Iba, MD  clopidogrel (PLAVIX) 75 MG tablet TAKE 1 TABLET EVERY DAY 09/07/17   Antonieta Iba, MD  ezetimibe (ZETIA) 10 MG tablet TAKE 1 TABLET (10 MG TOTAL) BY MOUTH DAILY. 09/07/17   Antonieta Iba, MD  methocarbamol (ROBAXIN) 500 MG tablet Take 1 tablet (500 mg total) by mouth 4 (four) times daily. 12/20/17   Tommi Rumps, PA-C  metoprolol succinate (TOPROL-XL) 25 MG 24 hr tablet TAKE 1 TABLET BY MOUTH DAILY WITH OR IMMEDIATLEY FOLLOWING A MEAL 09/07/17   Antonieta Iba, MD  Multiple Vitamin (MULTIVITAMIN) tablet Take 1 tablet by mouth daily.      [provider]  Omega-3 Fatty Acids (OMEGA-3 FISH OIL) 1200 MG CAPS Take 1,200 mg by mouth daily.     [provider]  omeprazole (PRILOSEC OTC) 20 MG tablet Take 20 mg by mouth daily.    [provider]  omeprazole (PRILOSEC) 20 MG capsule Take 1 capsule (20 mg total) by mouth daily. 10/02/17   Malva Limes, MD  oxyCODONE-acetaminophen (PERCOCET) 5-325 MG tablet Take 1 tablet by mouth every 6 (six)  hours as needed for severe pain. 12/20/17   Tommi Rumps, PA-C  vitamin B-12 (CYANOCOBALAMIN) 500 MCG tablet Take 500 mcg by mouth daily.    [provider]    Allergies Crestor [rosuvastatin]  Family History  Problem Relation Age of Onset  . Heart disease Mother        ICD and stents  . Stroke Mother        possibly  . CAD Father 30       MI  . CAD Sister        triple bypass  . CAD Brother 73  . Diabetes Neg Hx   . Cancer Neg Hx     Social History Social History   Tobacco Use  . Smoking status: Former Smoker    Packs/day: 1.00    Years: 3.00    Pack  years: 3.00    Types: Cigarettes    Last attempt to quit: 08/26/1979    Years since quitting: 38.3  . Smokeless tobacco: Never Used  Substance Use Topics  . Alcohol use: Yes    Alcohol/week: 0.6 oz    Types: 1 Standard drinks or equivalent per week    Comment: Socially  . Drug use: No    Review of Systems Constitutional: No fever/chills Cardiovascular: Denies chest pain. Respiratory: Denies shortness of breath. Gastrointestinal: No abdominal pain.  No nausea, no vomiting.  Genitourinary: Negative for dysuria.  Negative for hematuria. Musculoskeletal: Positive for low back pain.  Positive for radiculopathy left-sided. Skin: Negative for rash. Neurological: Negative for headaches, focal weakness or numbness. ____________________________________________   PHYSICAL EXAM:  VITAL SIGNS: ED Triage Vitals  Enc Vitals Group     BP 12/20/17 0756 (!) 164/102     Pulse Rate 12/20/17 0756 68     Resp 12/20/17 0756 16     Temp 12/20/17 0756 98.3 F (36.8 C)     Temp Source 12/20/17 0756 Oral     SpO2 12/20/17 0756 96 %     Weight 12/20/17 0757 225 lb (102.1 kg)     Height 12/20/17 0757 5\' 11"  (1.803 m)     Head Circumference --      Peak Flow --      Pain Score 12/20/17 0757 9     Pain Loc --      Pain Edu? --      Excl. in GC? --    Constitutional: Alert and oriented. Well appearing and in no acute distress. Eyes: Conjunctivae are normal.  Head: Atraumatic. Neck: No stridor.   Cardiovascular: Normal rate, regular rhythm. Grossly normal heart sounds.  Good peripheral circulation. Respiratory: Normal respiratory effort.  No retractions. Lungs CTAB. Gastrointestinal: Soft and nontender. No distention.  No CVA tenderness. Musculoskeletal: No gross deformities noted on examination of the low back.  There is moderate tenderness on palpation of the L5-S1 area and left paravertebral muscles over to the SI joint area.  Range of motion is difficult due to pain.  Straight leg raises  however are negative.  Good muscle strength bilaterally. Neurologic:  Normal speech and language. No gross focal neurologic deficits are appreciated.  Reflexes are 2+ bilaterally.  No gait instability. Skin:  Skin is warm, dry and intact. No rash noted. Psychiatric: Mood and affect are normal. Speech and behavior are normal.  ____________________________________________   LABS (all labs ordered are listed, but only abnormal results are displayed)  Labs Reviewed  URINALYSIS, COMPLETE (UACMP) WITH MICROSCOPIC - Abnormal; Notable for the following components:  Result Value   Color, Urine YELLOW (*)    APPearance CLEAR (*)    All other components within normal limits    RADIOLOGY  ED MD interpretation:   L-S spine with degenerative changes.  Official radiology report(s): Dg Lumbar Spine 2-3 Views  Result Date: 12/20/2017 CLINICAL DATA:  Low back pain, no known injury, initial encounter EXAM: LUMBAR SPINE - 3 VIEW COMPARISON:  None. FINDINGS: Five lumbar type vertebral bodies are well visualized. Vertebral body height is well maintained. Mild osteophytic changes are seen. No disc space narrowing is noted. No anterolisthesis is seen. Aortic calcifications are noted without aneurysmal dilatation. IMPRESSION: Mild degenerative change without acute abnormality. Electronically Signed   By: Alcide CleverMark  Lukens M.D.   On: 12/20/2017 09:17   ___________________________________________   PROCEDURES  Procedure(s) performed: None  Procedures  Critical Care performed: No  ____________________________________________   INITIAL IMPRESSION / ASSESSMENT AND PLAN / ED COURSE  As part of my medical decision making, I reviewed the following data within the electronic MEDICAL RECORD NUMBER Notes from prior ED visits and [place] Controlled Substance Database  Patient was given Percocet 7.5 and 5 mg of Valium while in the department.  X-rays were obtained of his lumbar spine.  He was made aware that he has  degenerative changes.  He was also given Toradol 30 mg IM prior to discharge.  He is to follow-up with his PCP and arrangements are already being made for him to have facet joint injections that he is already discussed with his PCP.  Patient was discharged with a prescription for Percocet 5/325 1 every 6 hours as needed for pain and Robaxin 500 mg every 6 hours as needed for muscle spasms.  He is encouraged to use moist heat or ice to his back as needed.  Patient is also encouraged to take his blood pressure medication when he gets home as he is not taking it today due to his nausea.  This has resolved prior to discharge.  ____________________________________________   FINAL CLINICAL IMPRESSION(S) / ED DIAGNOSES  Final diagnoses:  Acute left-sided low back pain with left-sided sciatica     ED Discharge Orders        Ordered    oxyCODONE-acetaminophen (PERCOCET) 5-325 MG tablet  Every 6 hours PRN     12/20/17 0934    methocarbamol (ROBAXIN) 500 MG tablet  4 times daily     12/20/17 0934       Note:  This document was prepared using Dragon voice recognition software and may include unintentional dictation errors.    Tommi RumpsSummers, Rhonda L, PA-C 12/20/17 1043    Tommi RumpsSummers, Rhonda L, PA-C 12/20/17 1044    Don PerkingVeronese, [place], MD 12/31/17 1328

## 2017-12-20 NOTE — ED Notes (Signed)
Pt c/o left lower back pain, pt states his PCP told him it was the sacroiliac joint, pt denies any injury, states he recently was buffing floors which may have triggered the pain. Pt states he was on prednisone recently, but completed the course.   Pt states the pain is worse when walking or laying flat. Pt states he had difficulty sleeping last night due to the pain.

## 2017-12-20 NOTE — ED Triage Notes (Signed)
Patient presents to the ED with left lower back pain.  Patient states pain began approx. 2 weeks ago without trauma.  Patient saw his pcp and was placed on prednisone.  Patient reports having difficulty sleeping last night due to pain.

## 2017-12-21 ENCOUNTER — Telehealth: Payer: Self-pay | Admitting: Family Medicine

## 2017-12-21 NOTE — Telephone Encounter (Signed)
Pt is requesting call back to discuss his referral to ortho. Pt stated that he doesn't care where he is referred just as long as they can see him asap. Pt stated that he is in a lot of pain. Please advise. Thanks TNP

## 2017-12-22 ENCOUNTER — Other Ambulatory Visit: Payer: Self-pay | Admitting: Unknown Physician Specialty

## 2017-12-22 DIAGNOSIS — M5417 Radiculopathy, lumbosacral region: Secondary | ICD-10-CM | POA: Diagnosis not present

## 2017-12-22 DIAGNOSIS — M25552 Pain in left hip: Secondary | ICD-10-CM

## 2017-12-23 ENCOUNTER — Other Ambulatory Visit: Payer: Self-pay | Admitting: Unknown Physician Specialty

## 2017-12-23 DIAGNOSIS — M25552 Pain in left hip: Secondary | ICD-10-CM

## 2017-12-23 DIAGNOSIS — M5417 Radiculopathy, lumbosacral region: Secondary | ICD-10-CM

## 2017-12-26 ENCOUNTER — Encounter (HOSPITAL_BASED_OUTPATIENT_CLINIC_OR_DEPARTMENT_OTHER): Payer: Self-pay

## 2017-12-26 ENCOUNTER — Ambulatory Visit (HOSPITAL_BASED_OUTPATIENT_CLINIC_OR_DEPARTMENT_OTHER)
Admission: RE | Admit: 2017-12-26 | Discharge: 2017-12-26 | Disposition: A | Discharge status: Home / Self Care | Payer: Medicare HMO | Source: Ambulatory Visit | Attending: Unknown Physician Specialty | Admitting: Unknown Physician Specialty

## 2017-12-26 DIAGNOSIS — M25552 Pain in left hip: Secondary | ICD-10-CM | POA: Diagnosis not present

## 2017-12-26 DIAGNOSIS — M47817 Spondylosis without myelopathy or radiculopathy, lumbosacral region: Secondary | ICD-10-CM | POA: Diagnosis not present

## 2017-12-26 DIAGNOSIS — M5417 Radiculopathy, lumbosacral region: Secondary | ICD-10-CM | POA: Insufficient documentation

## 2017-12-26 DIAGNOSIS — S73102A Unspecified sprain of left hip, initial encounter: Secondary | ICD-10-CM | POA: Insufficient documentation

## 2017-12-26 DIAGNOSIS — M5126 Other intervertebral disc displacement, lumbar region: Secondary | ICD-10-CM | POA: Diagnosis not present

## 2017-12-31 DIAGNOSIS — M5416 Radiculopathy, lumbar region: Secondary | ICD-10-CM | POA: Diagnosis not present

## 2017-12-31 DIAGNOSIS — M7072 Other bursitis of hip, left hip: Secondary | ICD-10-CM | POA: Diagnosis not present

## 2017-12-31 DIAGNOSIS — M1612 Unilateral primary osteoarthritis, left hip: Secondary | ICD-10-CM | POA: Diagnosis not present

## 2017-12-31 DIAGNOSIS — M25552 Pain in left hip: Secondary | ICD-10-CM | POA: Diagnosis not present

## 2018-01-04 DIAGNOSIS — M5126 Other intervertebral disc displacement, lumbar region: Secondary | ICD-10-CM | POA: Diagnosis not present

## 2018-01-04 DIAGNOSIS — M5416 Radiculopathy, lumbar region: Secondary | ICD-10-CM | POA: Diagnosis not present

## 2018-01-12 DIAGNOSIS — M461 Sacroiliitis, not elsewhere classified: Secondary | ICD-10-CM | POA: Diagnosis not present

## 2018-01-12 DIAGNOSIS — M545 Low back pain: Secondary | ICD-10-CM | POA: Diagnosis not present

## 2018-01-12 DIAGNOSIS — M9904 Segmental and somatic dysfunction of sacral region: Secondary | ICD-10-CM | POA: Diagnosis not present

## 2018-01-12 DIAGNOSIS — M9903 Segmental and somatic dysfunction of lumbar region: Secondary | ICD-10-CM | POA: Diagnosis not present

## 2018-01-13 DIAGNOSIS — M9904 Segmental and somatic dysfunction of sacral region: Secondary | ICD-10-CM | POA: Diagnosis not present

## 2018-01-13 DIAGNOSIS — M545 Low back pain: Secondary | ICD-10-CM | POA: Diagnosis not present

## 2018-01-13 DIAGNOSIS — M461 Sacroiliitis, not elsewhere classified: Secondary | ICD-10-CM | POA: Diagnosis not present

## 2018-01-13 DIAGNOSIS — M9903 Segmental and somatic dysfunction of lumbar region: Secondary | ICD-10-CM | POA: Diagnosis not present

## 2018-01-15 DIAGNOSIS — M545 Low back pain: Secondary | ICD-10-CM | POA: Diagnosis not present

## 2018-01-15 DIAGNOSIS — M9903 Segmental and somatic dysfunction of lumbar region: Secondary | ICD-10-CM | POA: Diagnosis not present

## 2018-01-15 DIAGNOSIS — M9904 Segmental and somatic dysfunction of sacral region: Secondary | ICD-10-CM | POA: Diagnosis not present

## 2018-01-15 DIAGNOSIS — M461 Sacroiliitis, not elsewhere classified: Secondary | ICD-10-CM | POA: Diagnosis not present

## 2018-01-18 DIAGNOSIS — M461 Sacroiliitis, not elsewhere classified: Secondary | ICD-10-CM | POA: Diagnosis not present

## 2018-01-18 DIAGNOSIS — M545 Low back pain: Secondary | ICD-10-CM | POA: Diagnosis not present

## 2018-01-18 DIAGNOSIS — M9903 Segmental and somatic dysfunction of lumbar region: Secondary | ICD-10-CM | POA: Diagnosis not present

## 2018-01-18 DIAGNOSIS — M9904 Segmental and somatic dysfunction of sacral region: Secondary | ICD-10-CM | POA: Diagnosis not present

## 2018-01-22 DIAGNOSIS — M9903 Segmental and somatic dysfunction of lumbar region: Secondary | ICD-10-CM | POA: Diagnosis not present

## 2018-01-22 DIAGNOSIS — M461 Sacroiliitis, not elsewhere classified: Secondary | ICD-10-CM | POA: Diagnosis not present

## 2018-01-22 DIAGNOSIS — M545 Low back pain: Secondary | ICD-10-CM | POA: Diagnosis not present

## 2018-01-22 DIAGNOSIS — M9904 Segmental and somatic dysfunction of sacral region: Secondary | ICD-10-CM | POA: Diagnosis not present

## 2018-01-26 DIAGNOSIS — M545 Low back pain: Secondary | ICD-10-CM | POA: Diagnosis not present

## 2018-01-26 DIAGNOSIS — M9903 Segmental and somatic dysfunction of lumbar region: Secondary | ICD-10-CM | POA: Diagnosis not present

## 2018-01-26 DIAGNOSIS — M9904 Segmental and somatic dysfunction of sacral region: Secondary | ICD-10-CM | POA: Diagnosis not present

## 2018-01-26 DIAGNOSIS — M461 Sacroiliitis, not elsewhere classified: Secondary | ICD-10-CM | POA: Diagnosis not present

## 2018-01-28 DIAGNOSIS — M5416 Radiculopathy, lumbar region: Secondary | ICD-10-CM | POA: Diagnosis not present

## 2018-01-28 DIAGNOSIS — M5126 Other intervertebral disc displacement, lumbar region: Secondary | ICD-10-CM | POA: Diagnosis not present

## 2018-02-16 DIAGNOSIS — M461 Sacroiliitis, not elsewhere classified: Secondary | ICD-10-CM | POA: Diagnosis not present

## 2018-02-16 DIAGNOSIS — M9903 Segmental and somatic dysfunction of lumbar region: Secondary | ICD-10-CM | POA: Diagnosis not present

## 2018-02-16 DIAGNOSIS — M545 Low back pain: Secondary | ICD-10-CM | POA: Diagnosis not present

## 2018-02-16 DIAGNOSIS — M9904 Segmental and somatic dysfunction of sacral region: Secondary | ICD-10-CM | POA: Diagnosis not present

## 2018-03-03 DIAGNOSIS — M9904 Segmental and somatic dysfunction of sacral region: Secondary | ICD-10-CM | POA: Diagnosis not present

## 2018-03-03 DIAGNOSIS — M9903 Segmental and somatic dysfunction of lumbar region: Secondary | ICD-10-CM | POA: Diagnosis not present

## 2018-03-03 DIAGNOSIS — M461 Sacroiliitis, not elsewhere classified: Secondary | ICD-10-CM | POA: Diagnosis not present

## 2018-03-03 DIAGNOSIS — M545 Low back pain: Secondary | ICD-10-CM | POA: Diagnosis not present

## 2018-03-23 DIAGNOSIS — G629 Polyneuropathy, unspecified: Secondary | ICD-10-CM | POA: Insufficient documentation

## 2018-03-23 NOTE — Progress Notes (Signed)
Cardiology Office Note  Date:  03/24/2018   ID:  Arafat, Cocuzza 01-23-1946, MRN [id number]  PCP:  Malva Limes, MD   Chief Complaint  Patient presents with  . other    12 mo follow up. Medications reviewed reviewed.     HPI:  Mr. Leicht is a very pleasant 72 year old gentleman with past medical history of  coronary artery disease,  severe proximal LAD disease in 2001 with stent placed at that time,  bypass surgery by Dr. Dorris Fetch in March of 2007  for 75% left main disease,   obesity,  Carotid stenosis 1-39% RICA, 40-59% LICA stenosis in 10/2016  who presents for follow up of his coronary artery disease  Had back pain, starting 11/2017 had cortisone 01/28/2018 Pain better  No regular exercise at this time, taking it easy because of his back Continues to have numbness in his toes Previously on Neurontin He attributes the toe numbness to his statin or Zetia  Tolerating Lipitor 40 ng daily, zetia He had myalgias on 80 mg daily  Continues to work in Airline pilot Weight trending up in the past 6 months No significant shortness of breath or chest pain  Lab work reviewed with him in detail  Labs 09/2017 Total chol 101, LDL 42 HBA1C 5.7  EKG personally reviewed by myself on todays visit Shows normal sinus rhythm rate 61 bpm no significant ST or T wave changes  Other past medical history  stress tests have come back positive both in 2001 and in  2007. 2001-lead to a stent in 2007, his bypass surgery.    PMH:   has a past medical history of Coronary artery disease (2007) and History of chicken pox.  PSH:    Past Surgical History:  Procedure Laterality Date  . COLONOSCOPY WITH PROPOFOL N/A 12/01/2017   Procedure: COLONOSCOPY WITH PROPOFOL;  Surgeon: Midge Minium, MD;  Location: [facility] ENDOSCOPY;  Service: Endoscopy;  Laterality: N/A;  . CORONARY ANGIOPLASTY    . CORONARY ARTERY BYPASS GRAFT  2007   2 vessel, [place]  . PERCUTANEOUS CORONARY STENT INTERVENTION (PCI-S)   2001   mid L circ  . TONSILLECTOMY      Current Outpatient Medications  Medication Sig Dispense Refill  . aspirin 81 MG EC tablet Take 81 mg by mouth daily.      Marland Kitchen atorvastatin (LIPITOR) 40 MG tablet TAKE 1 TABLET EVERY DAY 90 tablet 3  . clopidogrel (PLAVIX) 75 MG tablet TAKE 1 TABLET EVERY DAY 90 tablet 3  . ezetimibe (ZETIA) 10 MG tablet TAKE 1 TABLET (10 MG TOTAL) BY MOUTH DAILY. 90 tablet 3  . metoprolol succinate (TOPROL-XL) 25 MG 24 hr tablet TAKE 1 TABLET BY MOUTH DAILY WITH OR IMMEDIATLEY FOLLOWING A MEAL 90 tablet 3  . Multiple Vitamin (MULTIVITAMIN) tablet Take 1 tablet by mouth daily.      Marland Kitchen omeprazole (PRILOSEC) 20 MG capsule Take 1 capsule (20 mg total) by mouth daily. 90 capsule 3   No current facility-administered medications for this visit.      Allergies:   Crestor [rosuvastatin]   Social History:  The patient  reports that he quit smoking about 38 years ago. His smoking use included cigarettes. He has a 3.00 pack-year smoking history. He has never used smokeless tobacco. He reports that he drinks about 1.0 standard drinks of alcohol per week. He reports that he does not use drugs.   Family History:   family history includes CAD in his sister; CAD (age of  onset: 35) in his brother; CAD (age of onset: 30) in his father; Heart disease in his mother; Stroke in his mother.    Review of Systems: Review of Systems  Constitutional: Negative.   Respiratory: Negative.   Cardiovascular: Negative.   Gastrointestinal: Negative.   Musculoskeletal: Negative.   Neurological: Negative.        Numbness in his feet/toes  Psychiatric/Behavioral: Negative.   All other systems reviewed and are negative.    PHYSICAL EXAM: VS:  BP (!) 144/80 (BP Location: Left Arm, Patient Position: Sitting, Cuff Size: Normal)   Ht 5\' 11"  (1.803 m)   Wt 227 lb 12 oz (103.3 kg)   BMI 31.76 kg/m  , BMI Body mass index is 31.76 kg/m.  Constitutional:  oriented to person, place, and time. No  distress.  HENT:  Head: Normocephalic and atraumatic.  Eyes:  no discharge. No scleral icterus.  Neck: Normal range of motion. Neck supple. No JVD present.  Cardiovascular: Normal rate, regular rhythm, normal heart sounds and intact distal pulses. Exam reveals no gallop and no friction rub. No edema No murmur heard. Pulmonary/Chest: Effort normal and breath sounds normal. No stridor. No respiratory distress.  no wheezes.  no rales.  no tenderness.  Abdominal: Soft.  no distension.  no tenderness.  Musculoskeletal: Normal range of motion.  no  tenderness or deformity.  Neurological:  normal muscle tone. Coordination normal. No atrophy Skin: Skin is warm and dry. No rash noted. not diaphoretic.  Psychiatric:  normal mood and affect. behavior is normal. Thought content normal.   Recent Labs: 10/05/2017: ALT 31; BUN 12; Creatinine, Ser 0.93; Potassium 4.4; Sodium 143    Lipid Panel Lab Results  Component Value Date   CHOL 101 10/05/2017   HDL 31 (L) 10/05/2017   LDLCALC 42 10/05/2017   TRIG 141 10/05/2017      Wt Readings from Last 3 Encounters:  03/24/18 227 lb 12 oz (103.3 kg)  12/20/17 225 lb (102.1 kg)  12/10/17 230 lb (104.3 kg)       ASSESSMENT AND PLAN:  HYPERTENSION, BENIGN -  Weight is higher, blood pressure trending higher No changes to his medications but we have recommended low carbohydrate diet, exercise program, weight loss  Atherosclerosis of coronary artery bypass graft of native heart without angina pectoris -  Long discussion concerning options for ischemic work-up in the future if he ever has symptoms of shortness of breath or chest pain Discussed cardiac CTA, stress testing, cardiac catheterization, differences between the imaging modalities risk and benefit No indication for testing at this time  Paresthesia Numbness in his feet Previously seen by podiatry Blaming the statin  Pure hypercholesterolemia Cholesterol is at goal on the current lipid  regimen. No changes to the medications were made.  Numbers reviewed with him May be higher because of weight gain  Elevated glucose Less exercise recently in [place] to his back, recommended he start changing his habits   Total encounter time more than 25 minutes  Greater than 50% was spent in counseling and coordination of care with the patient   Disposition:   F/U  12 months   No orders of the defined types were placed in this encounter.    Signed, Dossie Arbour, M.D., Ph.D. 03/24/2018  [facility] [place], [place] [phone]

## 2018-03-24 ENCOUNTER — Encounter: Payer: Self-pay | Admitting: Cardiovascular Disease

## 2018-03-24 ENCOUNTER — Ambulatory Visit (INDEPENDENT_AMBULATORY_CARE_PROVIDER_SITE_OTHER): Payer: Medicare HMO | Admitting: Cardiovascular Disease

## 2018-03-24 VITALS — BP 144/80 | HR 61 | Ht 71.0 in | Wt 227.8 lb

## 2018-03-24 DIAGNOSIS — E7800 Pure hypercholesterolemia, unspecified: Secondary | ICD-10-CM | POA: Diagnosis not present

## 2018-03-24 DIAGNOSIS — I25708 Atherosclerosis of coronary artery bypass graft(s), unspecified, with other forms of angina pectoris: Secondary | ICD-10-CM | POA: Diagnosis not present

## 2018-03-24 DIAGNOSIS — I10 Essential (primary) hypertension: Secondary | ICD-10-CM | POA: Diagnosis not present

## 2018-03-24 DIAGNOSIS — I6523 Occlusion and stenosis of bilateral carotid arteries: Secondary | ICD-10-CM | POA: Diagnosis not present

## 2018-03-24 DIAGNOSIS — G629 Polyneuropathy, unspecified: Secondary | ICD-10-CM | POA: Diagnosis not present

## 2018-03-24 NOTE — Patient Instructions (Signed)

## 2018-03-24 NOTE — Addendum Note (Signed)
Addended by: Aurelio Jew on: 03/24/2018 08:50 AM   Modules accepted: Orders

## 2018-04-01 DIAGNOSIS — M461 Sacroiliitis, not elsewhere classified: Secondary | ICD-10-CM | POA: Diagnosis not present

## 2018-04-01 DIAGNOSIS — M9904 Segmental and somatic dysfunction of sacral region: Secondary | ICD-10-CM | POA: Diagnosis not present

## 2018-04-01 DIAGNOSIS — M9903 Segmental and somatic dysfunction of lumbar region: Secondary | ICD-10-CM | POA: Diagnosis not present

## 2018-04-01 DIAGNOSIS — M545 Low back pain: Secondary | ICD-10-CM | POA: Diagnosis not present

## 2018-04-07 ENCOUNTER — Ambulatory Visit (INDEPENDENT_AMBULATORY_CARE_PROVIDER_SITE_OTHER): Payer: Medicare HMO

## 2018-04-07 DIAGNOSIS — Z23 Encounter for immunization: Secondary | ICD-10-CM | POA: Diagnosis not present

## 2018-04-29 DIAGNOSIS — M9903 Segmental and somatic dysfunction of lumbar region: Secondary | ICD-10-CM | POA: Diagnosis not present

## 2018-04-29 DIAGNOSIS — M461 Sacroiliitis, not elsewhere classified: Secondary | ICD-10-CM | POA: Diagnosis not present

## 2018-04-29 DIAGNOSIS — M545 Low back pain: Secondary | ICD-10-CM | POA: Diagnosis not present

## 2018-04-29 DIAGNOSIS — M9904 Segmental and somatic dysfunction of sacral region: Secondary | ICD-10-CM | POA: Diagnosis not present

## 2018-05-18 DIAGNOSIS — M461 Sacroiliitis, not elsewhere classified: Secondary | ICD-10-CM | POA: Diagnosis not present

## 2018-05-18 DIAGNOSIS — M9903 Segmental and somatic dysfunction of lumbar region: Secondary | ICD-10-CM | POA: Diagnosis not present

## 2018-05-18 DIAGNOSIS — M9904 Segmental and somatic dysfunction of sacral region: Secondary | ICD-10-CM | POA: Diagnosis not present

## 2018-05-18 DIAGNOSIS — M545 Low back pain: Secondary | ICD-10-CM | POA: Diagnosis not present

## 2018-05-31 DIAGNOSIS — M9904 Segmental and somatic dysfunction of sacral region: Secondary | ICD-10-CM | POA: Diagnosis not present

## 2018-05-31 DIAGNOSIS — M461 Sacroiliitis, not elsewhere classified: Secondary | ICD-10-CM | POA: Diagnosis not present

## 2018-05-31 DIAGNOSIS — M545 Low back pain: Secondary | ICD-10-CM | POA: Diagnosis not present

## 2018-05-31 DIAGNOSIS — M9903 Segmental and somatic dysfunction of lumbar region: Secondary | ICD-10-CM | POA: Diagnosis not present

## 2018-06-14 DIAGNOSIS — M461 Sacroiliitis, not elsewhere classified: Secondary | ICD-10-CM | POA: Diagnosis not present

## 2018-06-14 DIAGNOSIS — M545 Low back pain: Secondary | ICD-10-CM | POA: Diagnosis not present

## 2018-06-14 DIAGNOSIS — M9904 Segmental and somatic dysfunction of sacral region: Secondary | ICD-10-CM | POA: Diagnosis not present

## 2018-06-14 DIAGNOSIS — M9903 Segmental and somatic dysfunction of lumbar region: Secondary | ICD-10-CM | POA: Diagnosis not present

## 2018-07-12 DIAGNOSIS — M545 Low back pain: Secondary | ICD-10-CM | POA: Diagnosis not present

## 2018-07-12 DIAGNOSIS — M461 Sacroiliitis, not elsewhere classified: Secondary | ICD-10-CM | POA: Diagnosis not present

## 2018-07-12 DIAGNOSIS — M9904 Segmental and somatic dysfunction of sacral region: Secondary | ICD-10-CM | POA: Diagnosis not present

## 2018-07-12 DIAGNOSIS — M9903 Segmental and somatic dysfunction of lumbar region: Secondary | ICD-10-CM | POA: Diagnosis not present

## 2018-07-16 ENCOUNTER — Other Ambulatory Visit: Payer: Self-pay | Admitting: Cardiovascular Disease

## 2018-07-16 ENCOUNTER — Other Ambulatory Visit: Payer: Self-pay | Admitting: Family Medicine

## 2018-07-16 DIAGNOSIS — K219 Gastro-esophageal reflux disease without esophagitis: Secondary | ICD-10-CM

## 2018-07-16 DIAGNOSIS — Z0100 Encounter for examination of eyes and vision without abnormal findings: Secondary | ICD-10-CM | POA: Diagnosis not present

## 2018-07-16 DIAGNOSIS — H524 Presbyopia: Secondary | ICD-10-CM | POA: Diagnosis not present

## 2018-10-01 ENCOUNTER — Other Ambulatory Visit: Payer: Self-pay | Admitting: Cardiovascular Disease

## 2018-12-17 ENCOUNTER — Other Ambulatory Visit: Payer: Self-pay | Admitting: Cardiovascular Disease

## 2019-01-17 ENCOUNTER — Telehealth: Payer: Self-pay | Admitting: Cardiovascular Disease

## 2019-01-17 NOTE — Telephone Encounter (Signed)
lmov to schedule ROV .  Patient wants a 8 am [facility] appointment.

## 2019-01-20 NOTE — Telephone Encounter (Signed)
Scheduled

## 2019-02-27 ENCOUNTER — Other Ambulatory Visit: Payer: Self-pay | Admitting: Cardiovascular Disease

## 2019-03-03 ENCOUNTER — Other Ambulatory Visit: Payer: Self-pay | Admitting: Cardiovascular Disease

## 2019-03-26 NOTE — Progress Notes (Signed)
Cardiology Office Note  Date:  03/28/2019   ID:  Leonard Hughes, Leonard Hughes 1945-11-26, MRN [id number]  PCP:  Birdie Sons, MD   Chief Complaint  Patient presents with  . other    12 month f/u no complaints today. Meds reviewed verbally with pt.    HPI:  Leonard Hughes is a very pleasant 73 year old gentleman with past medical history of  coronary artery disease,  severe proximal LAD disease in 2001 with stent placed at that time,  bypass surgery by Dr. 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 2001-lead to a stent in 2007, his bypass surgery.    PMH:   has a past medical history of Coronary artery disease (2007) and History of chicken pox.  PSH:    Past Surgical History:  Procedure Laterality Date  . COLONOSCOPY WITH PROPOFOL N/A 12/01/2017   Procedure: COLONOSCOPY WITH PROPOFOL;  Surgeon: Lucilla Lame, MD;  Location: [facility] ENDOSCOPY;  Service: Endoscopy;  Laterality: N/A;  . CORONARY ANGIOPLASTY    . CORONARY ARTERY BYPASS GRAFT  2007   2 vessel, [place]  . PERCUTANEOUS CORONARY STENT INTERVENTION (PCI-S)  2001   mid L circ  . TONSILLECTOMY      Current Outpatient Medications  Medication Sig Dispense Refill  . aspirin 81 MG EC tablet Take 81 mg by mouth daily.      Marland Kitchen atorvastatin (LIPITOR) 40 MG tablet TAKE 1 TABLET EVERY DAY 90 tablet 0  . clopidogrel (PLAVIX)  75 MG tablet TAKE 1 TABLET EVERY DAY 90 tablet 0  . ezetimibe (ZETIA) 10 MG tablet TAKE 1 TABLET EVERY DAY 90 tablet 0  . metoprolol succinate (TOPROL-XL) 25 MG 24 hr tablet TAKE 1 TABLET EVERY DAY WITH OR IMMEDIATELY FOLLOWING A MEAL 90 tablet 0  . Multiple Vitamin (MULTIVITAMIN) tablet Take 1 tablet by mouth daily.      Marland Kitchen omeprazole (PRILOSEC) 20 MG capsule TAKE 1 CAPSULE EVERY DAY 90 capsule 3   No current facility-administered medications for this visit.      Allergies:   Crestor [rosuvastatin]   Social History:  The patient  reports that he quit smoking about 39 years ago. His smoking use included cigarettes. He has a 3.00 pack-year smoking history. He has never used smokeless tobacco. He reports current alcohol use of about 1.0 standard drinks of alcohol per week. He reports that he does not use drugs.   Family History:   family history includes CAD in his sister; CAD (age of onset: 76) in his brother; CAD (age of onset: 73) in his father; Heart disease in his mother; Stroke in his mother.    Review of Systems: Review of Systems  Constitutional: Negative.   Respiratory: Negative.   Cardiovascular: Negative.   Gastrointestinal: Negative.   Musculoskeletal: Negative.   Neurological: Negative.  Numbness in his feet/toes  Psychiatric/Behavioral: Negative.   All other systems reviewed and are negative.   PHYSICAL EXAM: VS:  BP (!) 148/60 (BP Location: Left Arm, Patient Position: Sitting, Cuff Size: Large)   Pulse 65   Ht 5\' 11"  (1.803 m)   Wt 238 lb (108 kg)   SpO2 99%   BMI 33.19 kg/m  , BMI Body mass index is 33.19 kg/m.  Constitutional:  oriented to person, place, and time. No distress.  HENT:  Head: Grossly normal Eyes:  no discharge. No scleral icterus.  Neck: No JVD, no carotid bruits  Cardiovascular: Regular rate and rhythm, no murmurs appreciated Pulmonary/Chest: Clear to auscultation bilaterally, no wheezes or rails Abdominal: Soft.  no distension.  no  tenderness.  Musculoskeletal: Normal range of motion Neurological:  normal muscle tone. Coordination normal. No atrophy Skin: Skin warm and dry Psychiatric: normal affect, pleasant   Recent Labs: No results found for requested labs within last 8760 hours.    Lipid Panel Lab Results  Component Value Date   CHOL 101 10/05/2017   HDL 31 (L) 10/05/2017   LDLCALC 42 10/05/2017   TRIG 141 10/05/2017      Wt Readings from Last 3 Encounters:  03/28/19 238 lb (108 kg)  03/24/18 227 lb 12 oz (103.3 kg)  12/20/17 225 lb (102.1 kg)      ASSESSMENT AND PLAN:  HYPERTENSION, BENIGN -  Weight is higher, blood pressure trending higher Start losartan 50 mg daily Follow BP at home He will call if numbers continue to run high  Atherosclerosis of coronary artery bypass graft of native heart without angina pectoris -  Currently with no symptoms of angina. No further workup at this time. Continue current medication regimen.  Paresthesia Numbness in his feet Previously seen by podiatry Blaming the statin or zetia Suggested neurology  Pure hypercholesterolemia Cholesterol is at goal on the current lipid regimen. No changes to the medications were made.  Elevated glucose Weight up   Total encounter time more than 25 minutes  Greater than 50% was spent in counseling and coordination of care with the patient   Disposition:   F/U  12 months   Orders Placed This Encounter  Procedures  . EKG 12-Lead     Signed, 12/22/17, M.D., Ph.D. 03/28/2019  [facility] [place], [place]

## 2019-03-28 ENCOUNTER — Ambulatory Visit (INDEPENDENT_AMBULATORY_CARE_PROVIDER_SITE_OTHER): Payer: Medicare HMO | Admitting: Cardiovascular Disease

## 2019-03-28 ENCOUNTER — Other Ambulatory Visit: Payer: Self-pay

## 2019-03-28 ENCOUNTER — Encounter: Payer: Self-pay | Admitting: Cardiovascular Disease

## 2019-03-28 VITALS — BP 148/60 | HR 65 | Ht 71.0 in | Wt 238.0 lb

## 2019-03-28 DIAGNOSIS — I25708 Atherosclerosis of coronary artery bypass graft(s), unspecified, with other forms of angina pectoris: Secondary | ICD-10-CM

## 2019-03-28 DIAGNOSIS — E7800 Pure hypercholesterolemia, unspecified: Secondary | ICD-10-CM | POA: Diagnosis not present

## 2019-03-28 DIAGNOSIS — I739 Peripheral vascular disease, unspecified: Secondary | ICD-10-CM | POA: Diagnosis not present

## 2019-03-28 DIAGNOSIS — Z23 Encounter for immunization: Secondary | ICD-10-CM

## 2019-03-28 DIAGNOSIS — I6523 Occlusion and stenosis of bilateral carotid arteries: Secondary | ICD-10-CM | POA: Diagnosis not present

## 2019-03-28 DIAGNOSIS — I10 Essential (primary) hypertension: Secondary | ICD-10-CM | POA: Diagnosis not present

## 2019-03-28 MED ORDER — METOPROLOL SUCCINATE ER 25 MG PO TB24: ORAL_TABLET | ORAL | 3 refills | Status: DC

## 2019-03-28 MED ORDER — EZETIMIBE 10 MG PO TABS: 10.0000 mg | ORAL_TABLET | Freq: Every day | ORAL | 3 refills | Status: DC

## 2019-03-28 MED ORDER — LOSARTAN POTASSIUM 50 MG PO TABS: 50.0000 mg | ORAL_TABLET | Freq: Every day | ORAL | 3 refills | Status: DC

## 2019-03-28 MED ORDER — ATORVASTATIN CALCIUM 40 MG PO TABS: 40.0000 mg | ORAL_TABLET | Freq: Every day | ORAL | 3 refills | Status: DC

## 2019-03-28 MED ORDER — CLOPIDOGREL BISULFATE 75 MG PO TABS: 75.0000 mg | ORAL_TABLET | Freq: Every day | ORAL | 3 refills | Status: DC

## 2019-03-28 NOTE — Patient Instructions (Addendum)
FLU SHOT TODAY  Medication Instructions:  Your physician has recommended you make the following change in your medication:   1. START Losartan 50 mg once daily in the AM for blood pressure  Your physician has requested that you regularly monitor and record your blood pressure readings at home. Please use the same machine at the same time of day to check your readings and record them to bring to your follow-up visit. Please call us if they remain elevated.  If you need a refill on your cardiac medications before your next appointment, please call your pharmacy.    Lab work: No new labs needed   If you have labs (blood work) drawn today and your tests are completely normal, you will receive your results only by: Marland Kitchen MyChart Message (if you have MyChart) OR . A paper copy in the mail If you have any lab test that is abnormal or we need to change your treatment, we will call you to review the results.   Testing/Procedures: No new testing needed   Follow-Up: At [facility], you and your health needs are our priority.  As part of our continuing mission to provide you with exceptional heart care, we have created designated Provider Care Teams.  These Care Teams include your primary Cardiologist (physician) and Advanced Practice Providers (APPs -  Physician Assistants and Nurse Practitioners) who all work together to provide you with the care you need, when you need it.  . You will need a follow up appointment in 12 months .   Please call our office 2 months in advance to schedule this appointment.    . Providers on your designated Care Team:   . Murray Hodgkins, NP . Christell Faith, PA-C . Marrianne Mood, PA-C  Any Other Special Instructions Will Be Listed Below (If Applicable).  For educational health videos Log in to : [email or web address] Or : [email or web address], password : [email or web address]

## 2019-05-13 ENCOUNTER — Other Ambulatory Visit: Payer: Self-pay | Admitting: Cardiovascular Disease

## 2019-05-19 ENCOUNTER — Other Ambulatory Visit: Payer: Self-pay | Admitting: Cardiovascular Disease

## 2019-06-14 ENCOUNTER — Telehealth: Payer: Self-pay | Admitting: Cardiovascular Disease

## 2019-06-14 DIAGNOSIS — I25708 Atherosclerosis of coronary artery bypass graft(s), unspecified, with other forms of angina pectoris: Secondary | ICD-10-CM

## 2019-06-14 NOTE — Telephone Encounter (Signed)
   Pt c/o medication issue:  1. Name of Medication: atorvastatin   2. How are you currently taking this medication (dosage and times per day)? Holding x 1 month   3. Are you having a reaction (difficulty breathing--STAT)?  Had numbness and cramps improved / stopped after holding   4. What is your medication issue? Patient wants to discuss alternative.

## 2019-06-14 NOTE — Telephone Encounter (Signed)
Spoke with patient and he reports that since holding the atorvastatin his numbness and cramps improved and now feels much better. He has been off for a month and would like to know if he should try something different or just stay on the zetia. Advised that I would send to provider for further recommendations and will remove atorvastatin from his list. He was appreciative for the call with no further questions at this time.

## 2019-06-15 NOTE — Telephone Encounter (Signed)
We will try a different cholesterol medication hopefully with better results Try Crestor 10 mg daily stay on Zetia

## 2019-06-15 NOTE — Telephone Encounter (Signed)
Pt reported that he will not be able to take Crestor as it is listed as ax in chart.   Pt agrees to continue zetia for now and will wait for further instruction from Dr. Mariah Milling.

## 2019-06-16 NOTE — Telephone Encounter (Signed)
Sounds like he needs Repatha or Praluent Should qualify given coronary disease history of bypass Would stay on Zetia Perhaps wait a month or 2 recheck lipids then would probably qualify as cholesterol will be high

## 2019-06-17 NOTE — Telephone Encounter (Signed)
Spoke with patient and he really is not sure that he could do an injectable medication. He was agreeable to have labs done and then readdress any potential medication options. He prefers pill form options but if necessary will consider injectable meds. Instructed him to have lipid profile done at the [facility] Entr[facility] mid March and to not eat or drink anything other than sip of water with pills. He verbalized understanding of our conversation, agreement with plan, and had no further questions at this time.

## 2019-06-19 ENCOUNTER — Other Ambulatory Visit: Payer: Self-pay | Admitting: Family Medicine

## 2019-06-19 DIAGNOSIS — K219 Gastro-esophageal reflux disease without esophagitis: Secondary | ICD-10-CM

## 2019-07-22 DIAGNOSIS — H524 Presbyopia: Secondary | ICD-10-CM | POA: Diagnosis not present

## 2019-07-22 DIAGNOSIS — Z0100 Encounter for examination of eyes and vision without abnormal findings: Secondary | ICD-10-CM | POA: Diagnosis not present

## 2019-11-23 IMAGING — MR MR LUMBAR SPINE W/O CM
6 series · 40 of 48 positions shown · non-contrast
Comparison: Radiography 12/20/2017

CLINICAL DATA: Left buttock and groin pain over the last 2 weeks.
Recent injection without relief.

EXAM:
MRI LUMBAR SPINE WITHOUT CONTRAST
TECHNIQUE: Multiplanar, multisequence MR imaging of the lumbar spine was
performed. No intravenous contrast was administered.

[Series 2: T1 · sagittal · 4.0mm · 0.81mm/px · 4 of 15 slices shown (1 of 2)]
[im 1/15]
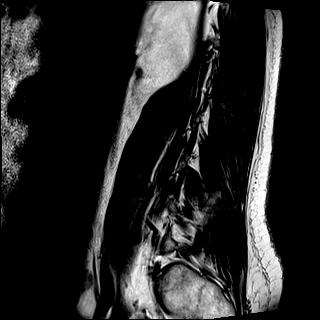
[im 5/15]
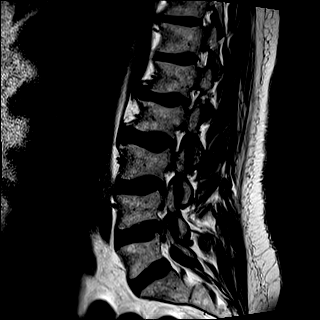
[im 10/15]
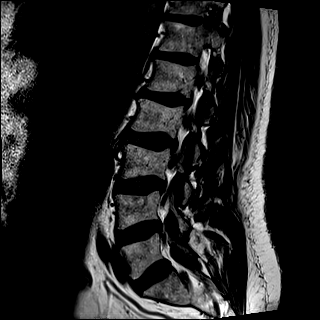
[im 15/15]
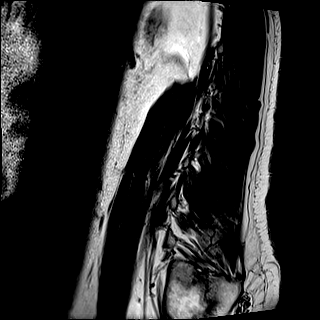

[Series 3: T2 · sagittal · 4.0mm · 0.81mm/px · 5 of 15 slices shown (1 of 2)]
[im 1/15]
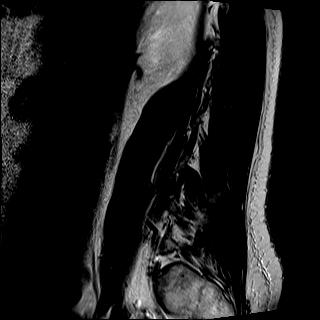
[im 4/15]
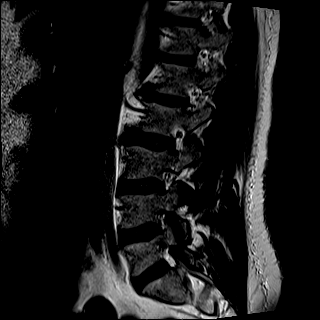
[im 8/15]
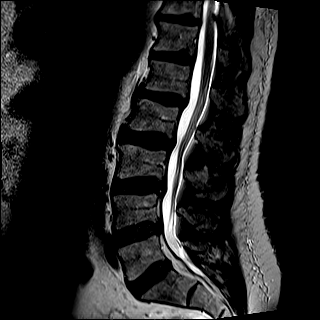
[im 11/15]
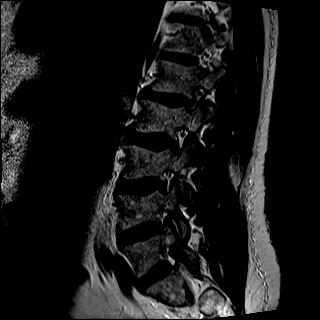
[im 15/15]
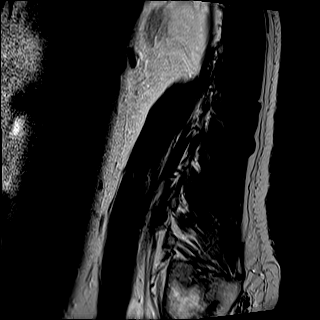

[Series 4: STIR · sagittal · 4.0mm · 1.02mm/px · 5 of 15 slices shown]
[im 1/15]
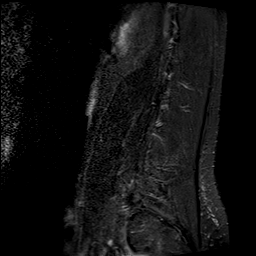
[im 4/15]
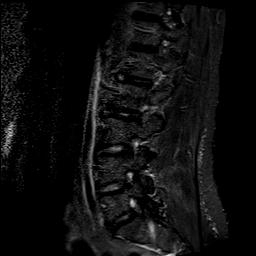
[im 8/15]
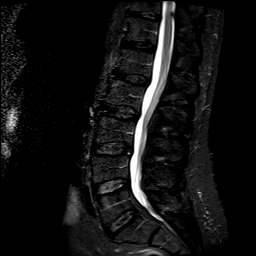
[im 11/15]
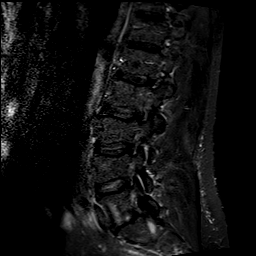
[im 15/15]
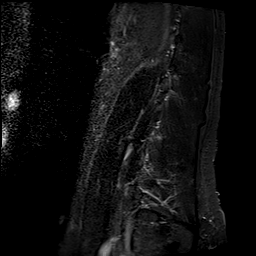

[Series 5: T2 · axial · 4.0mm · 0.62mm/px · z∈[-157,+81]mm · 12 of 42 slices shown (2 of 2)]
[im 1/42]
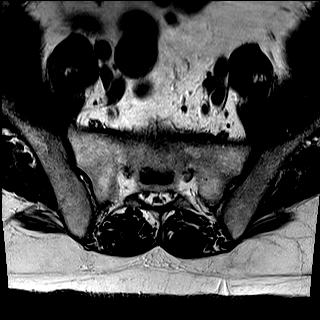
[im 4/42]
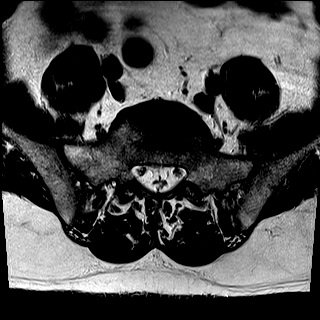
[im 7/42]
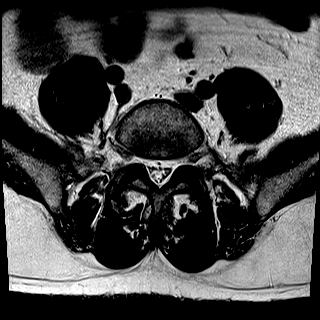
[im 10/42]
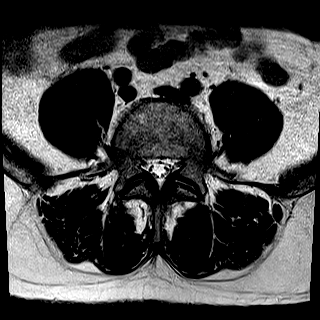
[im 13/42]
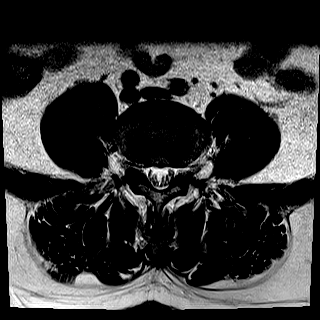
[im 16/42]
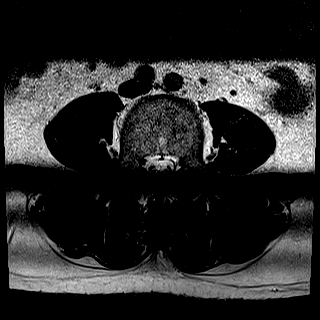
[im 19/42]
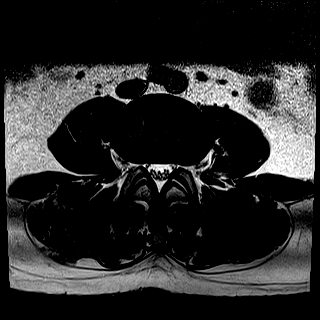
[im 23/42]
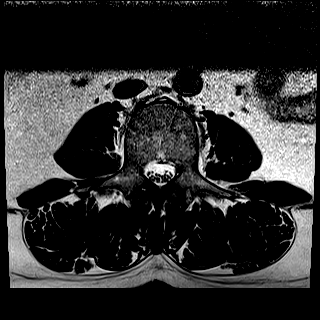
[im 26/42]
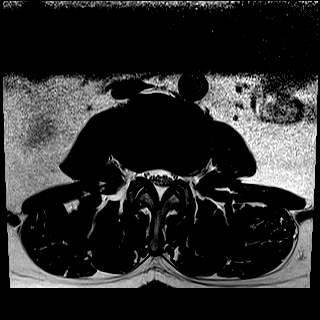
[im 29/42]
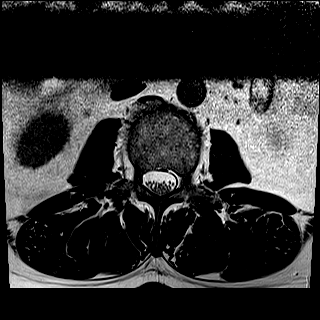
[im 35/42]
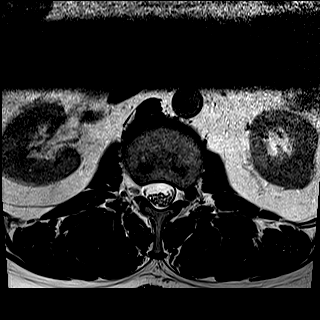
[im 42/42]
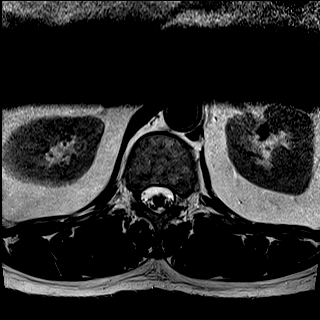

[Series 6: T1 · axial · 4.0mm · 0.78mm/px · z∈[-157,+81]mm · 8 of 42 slices shown (2 of 2)]
[im 1/42]
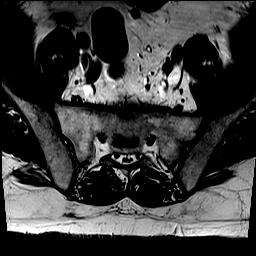
[im 7/42]
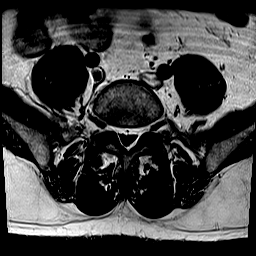
[im 13/42]
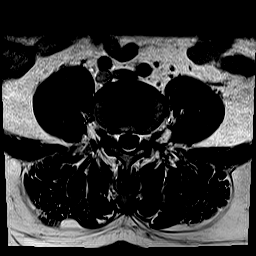
[im 19/42]
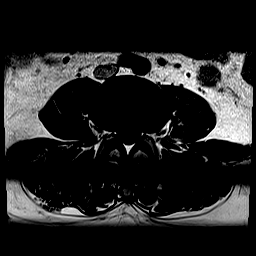
[im 23/42]
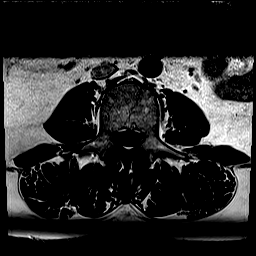
[im 29/42]
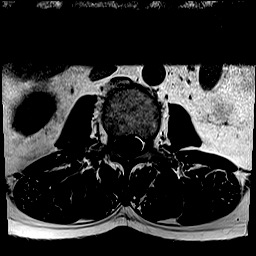
[im 35/42]
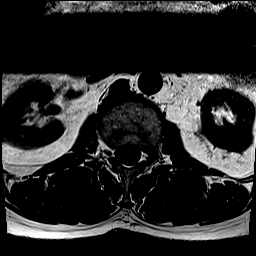
[im 42/42]
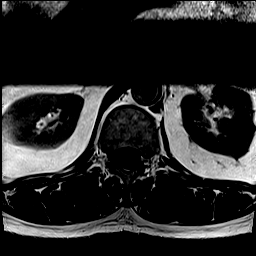

[Series 100: hx · axial · 10.0mm · 0.62mm/px · z∈[-24,+160]mm · 6 of 18 slices shown]
[im 1/18]
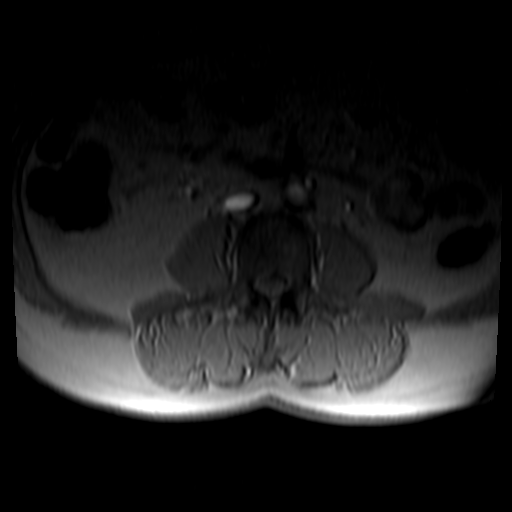
[im 4/18]
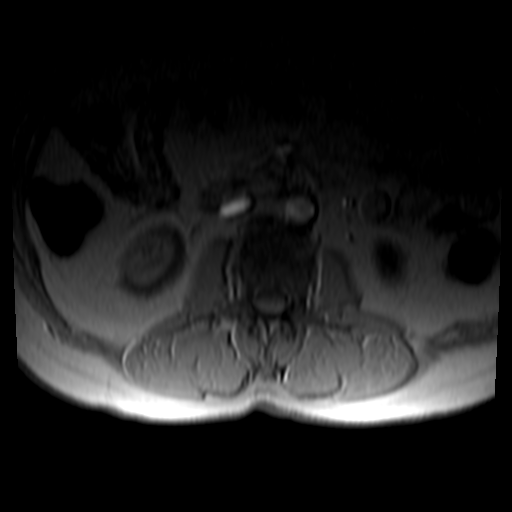
[im 7/18]
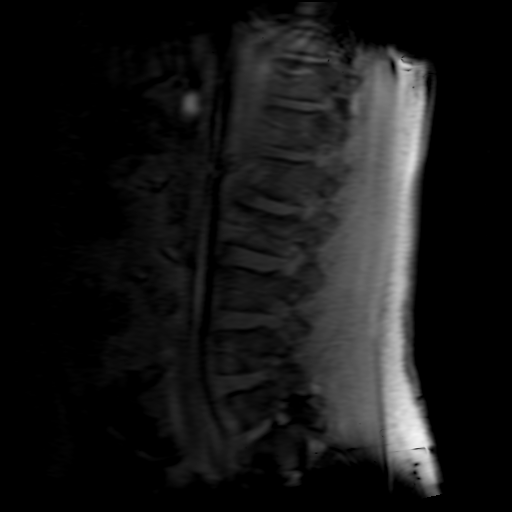
[im 11/18]
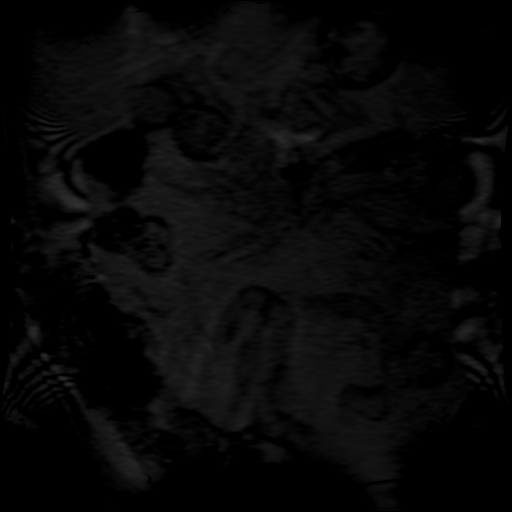
[im 14/18]
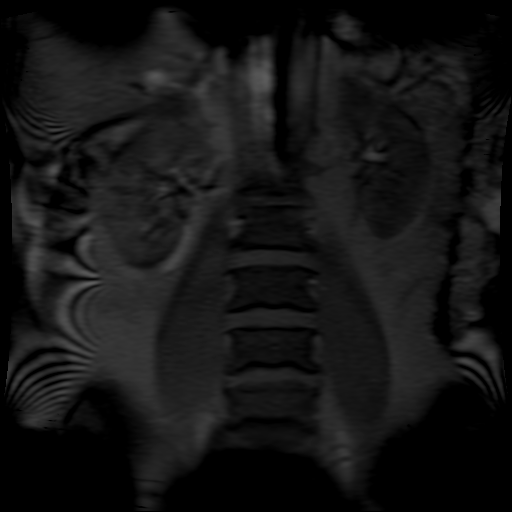
[im 18/18]
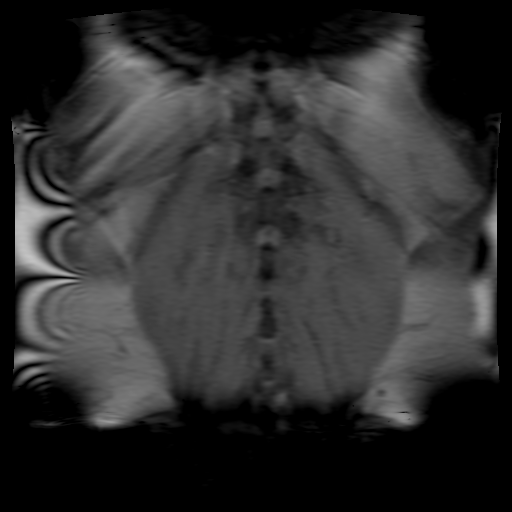

[40 of 48 positions shown; findings below may reference images not displayed]

FINDINGS: Segmentation:  5 lumbar type vertebral bodies.

Alignment:  Normal

Vertebrae:  No fracture or primary bone finding.

Conus medullaris and cauda equina: Conus extends to the L1 level.
Conus and cauda equina appear normal.

Paraspinal and other soft tissues: Negative

Disc levels:

T11-12: Disc bulge.  Facet degeneration and hypertrophy.

T12-L1: Normal interspace.

L1-2: Left foraminal to extraforaminal disc herniation with a small
disc fragment likely compressing the left L1 nerve.

L2-3: Mild bulging of the disc. Annular fissures in the left
foraminal to extraforaminal region without herniated disc material.
No compressive stenosis. Annular fissures can be associated with
neural irritation.

L3-4: Disc bulge. Annular fissures in the left foraminal to
extraforaminal region. No compressive stenosis. Annular fissures can
be associated with neural irritation.

L4-5: Mild bulging of the disc. Small bilateral annular fissures. No
compressive stenosis. Annular fissures can be associated with neural
irritation. Mild facet osteoarthritis which could be symptomatic.

L5-S1: Mild bulging of the disc. No stenosis. Mild facet
osteoarthritis which could be symptomatic.
IMPRESSION: In this patient with left buttock and groin pain, there is a left
foraminal to extraforaminal disc herniation at L1-2 likely
compressing the left L1 nerve.

Disc bulges and annular fissures at L2-3, L3-4 and L4-5. No neural
compression seen at those levels. Annular fissures can be associated
with neural irritation.

Facet osteoarthritis at L4-5 and L5-S1 that could be associated with
low back pain or referred facet syndrome pain.

## 2020-01-21 ENCOUNTER — Other Ambulatory Visit: Payer: Self-pay | Admitting: Cardiovascular Disease

## 2020-01-24 ENCOUNTER — Telehealth: Payer: Self-pay | Admitting: Cardiovascular Disease

## 2020-01-24 DIAGNOSIS — I25708 Atherosclerosis of coronary artery bypass graft(s), unspecified, with other forms of angina pectoris: Secondary | ICD-10-CM

## 2020-01-24 DIAGNOSIS — E7800 Pure hypercholesterolemia, unspecified: Secondary | ICD-10-CM

## 2020-01-24 NOTE — Telephone Encounter (Signed)
Patient hasn't had labs in a while and would like to add a full blood panel to check for everything [facility].  Please call to discuss .

## 2020-01-26 NOTE — Telephone Encounter (Signed)
I spoke with the patient. I advised him of Dr. Windell Hummingbird response as below. Per the patient, he has not seen Dr. Sherrie Mustache and has no plans to see him anytime soon due to COVID.   I advised Dr. Mariah Milling typically does not order a full panel of labs, but I will let him know that we spoke and see what he may want to order. We may need to reach out to his PCP as well.  The patient is aware we will call him back after further review with Dr. Mariah Milling. He his agreeable.  His follow appointment with Dr. Mariah Milling is not scheduled until 03/27/20.

## 2020-01-26 NOTE — Telephone Encounter (Signed)
Triage, Can we ask him if he plans on seeing Dr. Sherrie Mustache, He has not seen him in 2 years. Perhaps in conjunction with Dr. Sherrie Mustache if patient would like full set of labs I can find out what Dr. Sherrie Mustache would like I am happy to order so he can have one trip to the lab for everything TG

## 2020-01-30 NOTE — Telephone Encounter (Signed)
We can order lab work as we get closer to his appointment CBC, CMP, lipids, TSH

## 2020-01-31 NOTE — Telephone Encounter (Signed)
Patient made aware of Dr. Windell Hummingbird response and recommendation. Lab orders placed for fasting lipid, cmet, cbc, tsh [facility] a couple of days prior to the patients scheduled 03/27/20 appt with Dr. Mariah Milling.  Patient verbalized understanding and voiced appreciation for the assistance.

## 2020-01-31 NOTE — Telephone Encounter (Signed)
Patient returning call.

## 2020-01-31 NOTE — Telephone Encounter (Signed)
Called to give the patient Dr. Gollan's response and recommendation. [email or web address]. 

## 2020-02-07 DIAGNOSIS — R69 Illness, unspecified: Secondary | ICD-10-CM | POA: Diagnosis not present

## 2020-02-16 ENCOUNTER — Other Ambulatory Visit: Payer: Self-pay | Admitting: Cardiovascular Disease

## 2020-03-20 ENCOUNTER — Other Ambulatory Visit
Admission: RE | Admit: 2020-03-20 | Discharge: 2020-03-20 | Disposition: A | Discharge status: Home / Self Care | Payer: Medicare HMO | Attending: Cardiovascular Disease | Admitting: Cardiovascular Disease

## 2020-03-20 DIAGNOSIS — E7800 Pure hypercholesterolemia, unspecified: Secondary | ICD-10-CM

## 2020-03-20 DIAGNOSIS — I25708 Atherosclerosis of coronary artery bypass graft(s), unspecified, with other forms of angina pectoris: Secondary | ICD-10-CM | POA: Diagnosis not present

## 2020-03-20 LAB — COMPREHENSIVE METABOLIC PANEL
ALT: 21 U/L (ref 0–44)
AST: 24 U/L (ref 15–41)
Albumin: 4.2 g/dL (ref 3.5–5.0)
Alkaline Phosphatase: 49 U/L (ref 38–126)
Anion gap: 9 (ref 5–15)
BUN: 13 mg/dL (ref 8–23)
CO2: 29 mmol/L (ref 22–32)
Calcium: 8.8 mg/dL — ABNORMAL LOW (ref 8.9–10.3)
Chloride: 102 mmol/L (ref 98–111)
Creatinine, Ser: 1.08 mg/dL (ref 0.61–1.24)
GFR, Estimated: >60 mL/min (ref >60)
Glucose, Bld: 112 mg/dL — ABNORMAL HIGH (ref 70–99)
Potassium: 4.9 mmol/L (ref 3.5–5.1)
Sodium: 140 mmol/L (ref 135–145)
Total Bilirubin: 0.9 mg/dL (ref 0.3–1.2)
Total Protein: 7.3 g/dL (ref 6.5–8.1)

## 2020-03-20 LAB — CBC WITH DIFFERENTIAL/PLATELET
Abs Immature Granulocytes: 0.01 10*3/uL (ref 0.00–0.07)
Basophils Absolute: 0 10*3/uL (ref 0.0–0.1)
Basophils Relative: 0 %
Eosinophils Absolute: 0.1 10*3/uL (ref 0.0–0.5)
Eosinophils Relative: 2 %
HCT: 42.4 % (ref 39.0–52.0)
Hemoglobin: 14.4 g/dL (ref 13.0–17.0)
Immature Granulocytes: 0 %
Lymphocytes Relative: 40 %
Lymphs Abs: 2.3 10*3/uL (ref 0.7–4.0)
MCH: 31.3 pg (ref 26.0–34.0)
MCHC: 34 g/dL (ref 30.0–36.0)
MCV: 92.2 fL (ref 80.0–100.0)
Monocytes Absolute: 0.5 10*3/uL (ref 0.1–1.0)
Monocytes Relative: 8 %
Neutro Abs: 2.8 10*3/uL (ref 1.7–7.7)
Neutrophils Relative %: 50 %
Platelets: 169 10*3/uL (ref 150–400)
RBC: 4.6 MIL/uL (ref 4.22–5.81)
RDW: 11.9 % (ref 11.5–15.5)
WBC: 5.8 10*3/uL (ref 4.0–10.5)
nRBC: 0 % (ref 0.0–0.2)

## 2020-03-20 LAB — LIPID PANEL
Cholesterol: 126 mg/dL (ref 0–200)
HDL: 37 mg/dL — ABNORMAL LOW (ref >40)
LDL Cholesterol: 63 mg/dL (ref 0–99)
Total CHOL/HDL Ratio: 3.4 RATIO
Triglycerides: 130 mg/dL (ref <150)
VLDL: 26 mg/dL (ref 0–40)

## 2020-03-20 LAB — TSH: TSH: 1.508 u[IU]/mL (ref 0.350–4.500)

## 2020-03-26 NOTE — Progress Notes (Signed)
Cardiology Office Note  Date:  03/27/2020   ID:  Leonard Hughes, DOB 1945/09/30, MRN [id number]  PCP:  Leonard Limes, MD   Chief Complaint  Patient presents with  . office visit    12 month F/U; Meds verbally reviewed with patient.    HPI:  [name] is a very pleasant 74 year old gentleman with past medical history of  coronary artery disease,  severe proximal LAD disease in 2001 with stent placed at that time,  bypass surgery by Dr. Dorris Hughes in March of 2007  for 75% left main disease,  obesity,  Carotid stenosis 1-39% RICA, 40-59% LICA stenosis in 10/2016 who presents for follow up of his coronary artery disease  Bothered by neuropathy, numb in toes, worse at night Thought it was lipitor, no change, Rare cramps Now back on lipitor, no problems, rare cram,ps, takes tonic water  Toes numb, leg hurt, started maybe as far back as 2014, noted in chart notes 05/2015 At night in bed walking ok   back pain, starting 11/2017 had cortisone 01/28/2018  Continues to work in [employer] work reviewed with him in detail  Lab Results  Component Value Date   CHOL 126 03/20/2020   HDL 37 (L) 03/20/2020   LDLCALC 63 03/20/2020   TRIG 130 03/20/2020    EKG personally reviewed by myself on todays visit Shows normal sinus rhythm rate 54 bpm no significant ST or T wave changes  Other past medical history  stress tests have come back positive both in 2001 and in  2007. 2001-lead to a stent in 2007, his bypass surgery.    PMH:   has a past medical history of Coronary artery disease (2007) and History of chicken pox.  PSH:    Past Surgical History:  Procedure Laterality Date  . COLONOSCOPY WITH PROPOFOL N/A 12/01/2017   Procedure: COLONOSCOPY WITH PROPOFOL;  Surgeon: Leonard Minium, MD;  Location: [facility] ENDOSCOPY;  Service: Endoscopy;  Laterality: N/A;  . CORONARY ANGIOPLASTY    . CORONARY ARTERY BYPASS GRAFT  2007   2 vessel, Leonard Hughes  . PERCUTANEOUS CORONARY STENT INTERVENTION  (PCI-S)  2001   mid L circ  . TONSILLECTOMY      Current Outpatient Medications  Medication Sig Dispense Refill  . aspirin 81 MG EC tablet Take 81 mg by mouth daily.      Marland Kitchen atorvastatin (LIPITOR) 40 MG tablet Take 1 tablet (40 mg total) by mouth daily. 90 tablet 3  . clopidogrel (PLAVIX) 75 MG tablet TAKE 1 TABLET EVERY DAY 90 tablet 3  . ezetimibe (ZETIA) 10 MG tablet TAKE 1 TABLET EVERY DAY 90 tablet 3  . losartan (COZAAR) 50 MG tablet TAKE 1 TABLET (50 MG TOTAL) BY MOUTH DAILY. 90 tablet 0  . metoprolol succinate (TOPROL-XL) 25 MG 24 hr tablet TAKE 1 TABLET EVERY DAY WITH OR IMMEDIATELY FOLLOWING A MEAL 90 tablet 0  . Multiple Vitamin (MULTIVITAMIN) tablet Take 1 tablet by mouth daily.      Marland Kitchen omeprazole (PRILOSEC) 20 MG capsule TAKE 1 CAPSULE EVERY DAY 90 capsule 3   No current facility-administered medications for this visit.    Allergies:   Crestor [rosuvastatin]   Social History:  The patient  reports that he quit smoking about 40 years ago. His smoking use included cigarettes. He has a 3.00 pack-year smoking history. He has never used smokeless tobacco. He reports current alcohol use of about 1.0 standard drink of alcohol per week. He reports that he does not  use drugs.   Family History:   family history includes CAD in his sister; CAD (age of onset: 14) in his brother; CAD (age of onset: 25) in his father; Heart disease in his mother; Stroke in his mother.    Review of Systems: Review of Systems  Constitutional: Negative.   Respiratory: Negative.   Cardiovascular: Negative.   Gastrointestinal: Negative.   Musculoskeletal: Negative.   Neurological: Negative.        Numbness in his feet/toes  Psychiatric/Behavioral: Negative.   All other systems reviewed and are negative.   PHYSICAL EXAM: VS:  BP 140/70 (BP Location: Left Arm, Patient Position: Sitting, Cuff Size: Large)   Pulse (!) 54   Ht 5\' 11"  (1.803 m)   Wt 216 lb (98 kg)   SpO2 98%   BMI 30.13 kg/m  , BMI  Body mass index is 30.13 kg/m.  Constitutional:  oriented to person, place, and time. No distress.  HENT:  Head: Grossly normal Eyes:  no discharge. No scleral icterus.  Neck: No JVD, no carotid bruits  Cardiovascular: Regular rate and rhythm, no murmurs appreciated Pulmonary/Chest: Clear to auscultation bilaterally, no wheezes or rails Abdominal: Soft.  no distension.  no tenderness.  Musculoskeletal: Normal range of motion Neurological:  normal muscle tone. Coordination normal. No atrophy Skin: Skin warm and dry Psychiatric: normal affect, pleasant   Recent Labs: 03/20/2020: ALT 21; BUN 13; Creatinine, Ser 1.08; Hemoglobin 14.4; Platelets 169; Potassium 4.9; Sodium 140; TSH 1.508    Lipid Panel Lab Results  Component Value Date   CHOL 126 03/20/2020   HDL 37 (L) 03/20/2020   LDLCALC 63 03/20/2020   TRIG 130 03/20/2020      Wt Readings from Last 3 Encounters:  03/27/20 216 lb (98 kg)  03/28/19 238 lb (108 kg)  03/24/18 227 lb 12 oz (103.3 kg)      ASSESSMENT AND PLAN:  HYPERTENSION, BENIGN -  Blood pressure is well controlled on today's visit. No changes made to the medications.  Atherosclerosis of coronary artery bypass graft of native heart without angina pectoris -  Currently with no symptoms of angina. No further workup at this time. Continue current medication regimen.  Paresthesia Numbness in his feet Previously seen by podiatry Suggested neurology, suspect idiopathic  Pure hypercholesterolemia Cholesterol is at goal on the current lipid regimen. No changes to the medications were made.  Elevated glucose Weight down   Total encounter time more than 25 minutes  Greater than 50% was spent in counseling and coordination of care with the patient    Orders Placed This Encounter  Procedures  . EKG 12-Lead     Signed, 03/26/18, M.D., Ph.D. 03/27/2020  [facility] [place], [place]

## 2020-03-27 ENCOUNTER — Other Ambulatory Visit: Payer: Self-pay

## 2020-03-27 ENCOUNTER — Ambulatory Visit (INDEPENDENT_AMBULATORY_CARE_PROVIDER_SITE_OTHER): Payer: Medicare HMO | Admitting: Cardiovascular Disease

## 2020-03-27 ENCOUNTER — Encounter: Payer: Self-pay | Admitting: Cardiovascular Disease

## 2020-03-27 VITALS — BP 140/70 | HR 54 | Ht 71.0 in | Wt 216.0 lb

## 2020-03-27 DIAGNOSIS — E782 Mixed hyperlipidemia: Secondary | ICD-10-CM | POA: Diagnosis not present

## 2020-03-27 DIAGNOSIS — I25708 Atherosclerosis of coronary artery bypass graft(s), unspecified, with other forms of angina pectoris: Secondary | ICD-10-CM

## 2020-03-27 DIAGNOSIS — I739 Peripheral vascular disease, unspecified: Secondary | ICD-10-CM

## 2020-03-27 DIAGNOSIS — I10 Essential (primary) hypertension: Secondary | ICD-10-CM

## 2020-03-27 DIAGNOSIS — Z23 Encounter for immunization: Secondary | ICD-10-CM

## 2020-03-27 DIAGNOSIS — I6523 Occlusion and stenosis of bilateral carotid arteries: Secondary | ICD-10-CM

## 2020-03-27 MED ORDER — ATORVASTATIN CALCIUM 40 MG PO TABS
40.0000 mg | ORAL_TABLET | Freq: Every day | ORAL | 3 refills | Status: DC
Start: 2020-03-27 — End: 2021-03-27

## 2020-03-27 NOTE — Patient Instructions (Addendum)
Research Idiopathic neuropathy   Medication Instructions:  No changes  Monitor heart rate , If it always runs low (<55), call the office  If you need a refill on your cardiac medications before your next appointment, please call your pharmacy.    Lab work: No new labs needed   If you have labs (blood work) drawn today and your tests are completely normal, you will receive your results only by: Marland Kitchen MyChart Message (if you have MyChart) OR . A paper copy in the mail If you have any lab test that is abnormal or we need to change your treatment, we will call you to review the results.   Testing/Procedures: No new testing needed   Follow-Up: At [facility], you and your health needs are our priority.  As part of our continuing mission to provide you with exceptional heart care, we have created designated Provider Care Teams.  These Care Teams include your primary Cardiologist (physician) and Advanced Practice Providers (APPs -  Physician Assistants and Nurse Practitioners) who all work together to provide you with the care you need, when you need it.  . You will need a follow up appointment in 12 months  . Providers on your designated Care Team:   . Nicolasa Ducking, NP . Eula Listen, PA-C . Marisue Ivan, PA-C  Any Other Special Instructions Will Be Listed Below (If Applicable).  COVID-19 Vaccine Information can be found at: [email or web address] For questions related to vaccine distribution or appointments, please email [email or web address] or call [phone].

## 2020-04-04 ENCOUNTER — Other Ambulatory Visit: Payer: Self-pay | Admitting: Family Medicine

## 2020-04-04 DIAGNOSIS — K219 Gastro-esophageal reflux disease without esophagitis: Secondary | ICD-10-CM

## 2020-04-05 ENCOUNTER — Other Ambulatory Visit: Payer: Self-pay | Admitting: Cardiovascular Disease

## 2020-04-22 ENCOUNTER — Other Ambulatory Visit: Payer: Self-pay | Admitting: Cardiovascular Disease

## 2020-04-23 NOTE — Telephone Encounter (Signed)
Rx request sent to pharmacy.  

## 2020-05-02 ENCOUNTER — Other Ambulatory Visit: Payer: Self-pay

## 2020-05-02 MED ORDER — METOPROLOL SUCCINATE ER 25 MG PO TB24
25.0000 mg | ORAL_TABLET | Freq: Every day | ORAL | 3 refills | Status: DC
Start: 2020-05-02 — End: 2021-02-20

## 2020-06-01 ENCOUNTER — Encounter: Payer: Self-pay | Admitting: Family Medicine

## 2020-06-01 ENCOUNTER — Ambulatory Visit (INDEPENDENT_AMBULATORY_CARE_PROVIDER_SITE_OTHER): Payer: Medicare HMO | Admitting: Family Medicine

## 2020-06-01 ENCOUNTER — Other Ambulatory Visit: Payer: Self-pay

## 2020-06-01 VITALS — BP 168/82 | HR 55 | Temp 97.8°F | Resp 16 | Ht 71.0 in | Wt 218.0 lb

## 2020-06-01 DIAGNOSIS — E782 Mixed hyperlipidemia: Secondary | ICD-10-CM | POA: Diagnosis not present

## 2020-06-01 DIAGNOSIS — R7303 Prediabetes: Secondary | ICD-10-CM

## 2020-06-01 DIAGNOSIS — Z125 Encounter for screening for malignant neoplasm of prostate: Secondary | ICD-10-CM | POA: Diagnosis not present

## 2020-06-01 DIAGNOSIS — K219 Gastro-esophageal reflux disease without esophagitis: Secondary | ICD-10-CM | POA: Diagnosis not present

## 2020-06-01 DIAGNOSIS — R202 Paresthesia of skin: Secondary | ICD-10-CM

## 2020-06-01 DIAGNOSIS — Z0000 Encounter for general adult medical examination without abnormal findings: Secondary | ICD-10-CM | POA: Diagnosis not present

## 2020-06-01 DIAGNOSIS — R200 Anesthesia of skin: Secondary | ICD-10-CM

## 2020-06-01 DIAGNOSIS — Z289 Immunization not carried out for unspecified reason: Secondary | ICD-10-CM | POA: Diagnosis not present

## 2020-06-01 DIAGNOSIS — I10 Essential (primary) hypertension: Secondary | ICD-10-CM

## 2020-06-01 MED ORDER — SHINGRIX 50 MCG/0.5ML IM SUSR: 0.5000 mL | Freq: Once | INTRAMUSCULAR | 0 refills | Status: AC

## 2020-06-01 NOTE — Progress Notes (Deleted)
      Established patient visit   Patient: Leonard Hughes   DOB: 25-Oct-1945   75 y.o. Male  MRN: [id number] Visit Date: 06/01/2020  Today's healthcare provider: Mila Merry, MD   No chief complaint on file.  Subjective    HPI  ***  {Show patient history (optional):23778::" "}   Medications: Outpatient Medications Prior to Visit  Medication Sig  . aspirin 81 MG EC tablet Take 81 mg by mouth daily.    Marland Kitchen atorvastatin (LIPITOR) 40 MG tablet Take 1 tablet (40 mg total) by mouth daily.  . clopidogrel (PLAVIX) 75 MG tablet TAKE 1 TABLET (75 MG TOTAL) BY MOUTH DAILY.  Marland Kitchen ezetimibe (ZETIA) 10 MG tablet TAKE 1 TABLET EVERY DAY  . losartan (COZAAR) 50 MG tablet TAKE 1 TABLET (50 MG TOTAL) BY MOUTH DAILY.  . metoprolol succinate (TOPROL-XL) 25 MG 24 hr tablet Take 1 tablet (25 mg total) by mouth daily.  . Multiple Vitamin (MULTIVITAMIN) tablet Take 1 tablet by mouth daily.    Marland Kitchen omeprazole (PRILOSEC) 20 MG capsule TAKE 1 CAPSULE EVERY DAY   No facility-administered medications prior to visit.    Review of Systems  {Heme  Chem  Endocrine  Serology  Results Review (optional):23779::" "}  Objective    There were no vitals taken for this visit. {Show previous vital signs (optional):23777::" "}  Physical Exam  ***  No results found for any visits on 06/01/20.  Assessment & Plan     ***  No follow-ups on file.      {provider attestation***:1}   Mila Merry, MD  [facility] [phone] (phone) [phone] (fax)  [facility]

## 2020-06-01 NOTE — Progress Notes (Signed)
Complete physical exam   Patient: Leonard Hughes   DOB: [birth year]   75 y.o. Male  MRN: [id number] Visit Date: 06/01/2020  Today's healthcare provider: Lelon Huh, MD   Chief Complaint  Patient presents with  . Annual Exam  . Hypertension  . Hyperlipidemia   Subjective    Leonard Hughes is a 75 y.o. male who presents today for a complete physical exam.  He reports consuming a general diet. The patient does not participate in regular exercise at present. He generally feels fairly well. He reports sleeping well. He does have additional problems to discuss today.   Hypertension, follow-up  BP Readings from Last 3 Encounters:  06/01/20 (!) 168/82  03/27/20 140/70  03/28/19 (!) 148/60   Wt Readings from Last 3 Encounters:  06/01/20 218 lb (98.9 kg)  03/27/20 216 lb (98 kg)  03/28/19 238 lb (108 kg)     He was last seen for hypertension 2 years ago.  BP at that visit was 144/80. Management since that visit includes no medication changes.  He reports good compliance with treatment. He is not having side effects.  He is following a Regular diet. He is exercising. He does not smoke.  Use of agents associated with hypertension: none.   Outside blood pressures are not being checked. Symptoms: No chest pain No chest pressure  No palpitations No syncope  No dyspnea No orthopnea  No paroxysmal nocturnal dyspnea No lower extremity edema   Pertinent labs: Lab Results  Component Value Date   CHOL 126 03/20/2020   HDL 37 (L) 03/20/2020   LDLCALC 63 03/20/2020   TRIG 130 03/20/2020   CHOLHDL 3.4 03/20/2020   Lab Results  Component Value Date   NA 140 03/20/2020   K 4.9 03/20/2020   CREATININE 1.08 03/20/2020   GFRNONAA >60 03/20/2020   GFRAA 96 10/05/2017   GLUCOSE 112 (H) 03/20/2020     The ASCVD Risk score (Goff DC Jr., et al., 2013) failed to calculate for the following reasons:   The valid total cholesterol range is 130 to 320 mg/dL    Lipid/Cholesterol, Follow-up  Last lipid panel Other pertinent labs  Lab Results  Component Value Date   CHOL 126 03/20/2020   HDL 37 (L) 03/20/2020   LDLCALC 63 03/20/2020   TRIG 130 03/20/2020   CHOLHDL 3.4 03/20/2020   Lab Results  Component Value Date   ALT 21 03/20/2020   AST 24 03/20/2020   PLT 169 03/20/2020   TSH 1.508 03/20/2020     He was last seen for this 2 years ago.  Management since that visit includes no medication changes.  He reports good compliance with treatment. He is not having side effects.    GERD, Follow up:  The patient was last seen for GERD 2 years ago. Changes made since that visit include no medication changes.  He reports good compliance with treatment. He is not having side effects.    He also complains of numbness in his toes for years, along with cramps in his legs, most notably after getting in bed at night. At some point in the past he was prescribed gabapentin which he states did not work well.    Past Medical History:  Diagnosis Date  . Coronary artery disease 2007   CABG 2v Roxan Hockey)  . History of chicken pox    Past Surgical History:  Procedure Laterality Date  . COLONOSCOPY WITH PROPOFOL N/A 12/01/2017   Procedure: COLONOSCOPY  WITH PROPOFOL;  Surgeon: Midge Minium, MD;  Location: [facility] ENDOSCOPY;  Service: Endoscopy;  Laterality: N/A;  . CORONARY ANGIOPLASTY    . CORONARY ARTERY BYPASS GRAFT  2007   2 vessel, [place]  . PERCUTANEOUS CORONARY STENT INTERVENTION (PCI-S)  2001   mid L circ  . TONSILLECTOMY     Social History   Socioeconomic History  . Marital status: Married    Spouse name: Not on file  . Number of children: Not on file  . Years of education: Not on file  . Highest education level: Not on file  Occupational History  . Not on file  Tobacco Use  . Smoking status: Former Smoker    Packs/day: 1.00    Years: 3.00    Pack years: 3.00    Types: Cigarettes    Quit date: 08/26/1979    Years  since quitting: 40.7  . Smokeless tobacco: Never Used  Vaping Use  . Vaping Use: Never used  Substance and Sexual Activity  . Alcohol use: Yes    Alcohol/week: 1.0 standard drink    Types: 1 Standard drinks or equivalent per week    Comment: Socially  . Drug use: No  . Sexual activity: Not on file  Other Topics Concern  . Not on file  Social History Narrative   Caffeine: 2-3 cups coffee/day   Lives with wife, mother in law, cats and dog.  grown daughter   Occupation: Airline pilot, Scientist, water quality   Edu: BA at [employer]   Activity: walking 2 mi 5d/wk   Diet: good water, fruits/vegetables daily   Social Determinants of Health   Financial Resource Strain: Not on file  Food Insecurity: Not on file  Transportation Needs: Not on file  Physical Activity: Not on file  Stress: Not on file  Social Connections: Not on file  Intimate Partner Violence: Not on file   Family Status  Relation Name Status  . Mother  Deceased at age 62  . Father  Deceased at age 89  . Sister  Alive  . Brother  Deceased at age 5  . Neg Hx  (Not Specified)   Family History  Problem Relation Age of Onset  . Heart disease Mother        ICD and stents  . Stroke Mother        possibly  . CAD Father 71       MI  . CAD Sister        triple bypass  . CAD Brother 11  . Diabetes Neg Hx   . Cancer Neg Hx    Allergies  Allergen Reactions  . Crestor [Rosuvastatin] Other (See Comments)    myalgias    Patient Care Team: Malva Limes, MD as PCP - General (Family Medicine) Mariah Milling Tollie Pizza, MD as Consulting Physician (Cardiology)   Medications: Outpatient Medications Prior to Visit  Medication Sig  . aspirin 81 MG EC tablet Take 81 mg by mouth daily.  Marland Kitchen atorvastatin (LIPITOR) 40 MG tablet Take 1 tablet (40 mg total) by mouth daily.  . clopidogrel (PLAVIX) 75 MG tablet TAKE 1 TABLET (75 MG TOTAL) BY MOUTH DAILY.  Marland Kitchen ezetimibe (ZETIA) 10 MG tablet TAKE 1 TABLET EVERY DAY  . losartan (COZAAR) 50 MG tablet TAKE  1 TABLET (50 MG TOTAL) BY MOUTH DAILY.  . metoprolol succinate (TOPROL-XL) 25 MG 24 hr tablet Take 1 tablet (25 mg total) by mouth daily.  . Multiple Vitamin (MULTIVITAMIN) tablet Take 1 tablet by mouth  daily.  . omeprazole (PRILOSEC) 20 MG capsule TAKE 1 CAPSULE EVERY DAY   No facility-administered medications prior to visit.    Review of Systems  Constitutional: Negative.   HENT: Negative.   Eyes: Negative.   Respiratory: Negative.   Cardiovascular: Negative.   Gastrointestinal: Negative.   Endocrine: Negative.   Genitourinary: Negative.   Musculoskeletal: Negative.   Skin: Negative.   Allergic/Immunologic: Negative.   Neurological: Negative.   Hematological: Negative.   Psychiatric/Behavioral: Negative.       Objective    BP (!) 168/82   Pulse (!) 55   Temp 97.8 F (36.6 C)   Resp 16   Ht 5\' 11"  (1.803 m)   Wt 218 lb (98.9 kg)   BMI 30.40 kg/m    Physical Exam    General Appearance:    Mildly obese male. Alert, cooperative, in no acute distress, appears stated age  Head:    Normocephalic, without obvious abnormality, atraumatic  Eyes:    PERRL, conjunctiva/corneas clear, EOM's intact, fundi    benign, both eyes       Ears:    Normal TM's and external ear canals, both ears  Nose:   Nares normal, septum midline, mucosa normal, no drainage   or sinus tenderness  Throat:   Lips, mucosa, and tongue normal; teeth and gums normal  Neck:   Supple, symmetrical, trachea midline, no adenopathy;       thyroid:  No enlargement/tenderness/nodules; no carotid   bruit or JVD  Back:     Symmetric, no curvature, ROM normal, no CVA tenderness  Lungs:     Clear to auscultation bilaterally, respirations unlabored  Chest wall:    No tenderness or deformity  Heart:    Bradycardic. Normal rhythm. No murmurs, rubs, or gallops.  S1 and S2 normal  Abdomen:     Soft, non-tender, bowel sounds active all four quadrants,    no masses, no organomegaly  Genitalia:    deferred  Rectal:     deferred  Extremities:   All extremities are intact. No cyanosis or edema  Pulses:   2+ and symmetric all extremities  Skin:   Skin color, texture, turgor normal, no rashes or lesions  Lymph nodes:   Cervical, supraclavicular, and axillary nodes normal  Neurologic:   CNII-XII intact. Normal strength, sensation and reflexes      throughout     Last depression screening scores PHQ 2/9 Scores 06/01/2020 10/02/2017 09/29/2016  PHQ - 2 Score 0 0 0  PHQ- 9 Score 0 0 -   Last fall risk screening Fall Risk  06/01/2020  Falls in the past year? 0  Number falls in past yr: 0  Injury with Fall? 0  Risk for fall due to : No Fall Risks  Follow up Falls evaluation completed   Last Audit-C alcohol use screening Alcohol Use Disorder Test (AUDIT) 06/01/2020  1. How often do you have a drink containing alcohol? 2  2. How many drinks containing alcohol do you have on a typical day when you are drinking? 0  3. How often do you have six or more drinks on one occasion? 0  AUDIT-C Score 2  Alcohol Brief Interventions/Follow-up AUDIT Score <7 follow-up not indicated   A score of 3 or more in women, and 4 or more in men indicates increased risk for alcohol abuse, EXCEPT if all of the points are from question 1   No results found for any visits on 06/01/20.  Assessment & Plan  Routine Health Maintenance and Physical Exam  Exercise Activities and Dietary recommendations Goals   None     Immunization History  Administered Date(s) Administered  . Fluad Quad(high Dose 65+) 03/28/2019, 03/27/2020, 03/27/2020  . Influenza Split 06/28/2012  . Influenza, High Dose Seasonal PF 04/07/2018  . Influenza,inj,Quad PF,6+ Mos 06/28/2013, 07/03/2014, 03/13/2016, 03/24/2017  . Pneumococcal Conjugate-13 07/03/2014  . Pneumococcal Polysaccharide-23 06/28/2013  . Td 05/26/2005  . Tdap 09/24/2015  . Zoster 09/24/2015    Health Maintenance  Topic Date Due  . COVID-19 Vaccine (1) Never done  . TETANUS/TDAP   09/23/2025  . COLONOSCOPY (Pts 45-36yrs Insurance coverage will need to be confirmed)  12/02/2027  . INFLUENZA VACCINE  Completed  . Hepatitis C Screening  Completed  . PNA vac Low Risk Adult  Completed    Discussed health benefits of physical activity, and encouraged him to engage in regular exercise appropriate for his age and condition.  1. Annual physical exam  2. Prostate cancer screening  - PSA Total (Reflex To Free) ([facility] only)  3. Prescription for Shingrix. Vaccine not administered in office.   - Zoster Vaccine Adjuvanted [facility]) injection; Inject 0.5 mLs into the muscle once for 1 dose.  Dispense: 0.5 mL; Refill: 0  4. Numbness and tingling of both lower extremities Has tried gabapentin years ago. Consider trial of alpha-lipoic acid if labs normal.  - VITAMIN D 25 Hydroxy (Vit-D Deficiency, Fractures) - Vitamin B12  5. Prediabetes  - Hemoglobin A1c  6. Gastroesophageal reflux disease, unspecified whether esophagitis present Very well controlled current PPI - Magnesium  7. Paresthesia   8. Mixed hyperlipidemia He is tolerating atorvastatin and ezetimibe well with no adverse effects.    9. HYPERTENSION, BENIGN Not to goal. Is not schedule to see Dr. Mariah Milling in the near future. Is to monitor BP at home and will consider increasing losartan if remains elevated.    Very well controlled don current PPI.  - Magnesium       The entirety of the information documented in the History of Present Illness, Review of Systems and Physical Exam were personally obtained by me. Portions of this information were initially documented by the CMA and reviewed by me for thoroughness and accuracy.      Mila Merry, MD  [facility] [phone] (phone) [phone] (fax)  [facility]

## 2020-06-02 LAB — VITAMIN B12: Vitamin B-12: 544 pg/mL (ref 232–1245)

## 2020-06-02 LAB — VITAMIN D 25 HYDROXY (VIT D DEFICIENCY, FRACTURES): Vit D, 25-Hydroxy: 33.5 ng/mL (ref 30.0–100.0)

## 2020-06-02 LAB — HEMOGLOBIN A1C
Est. average glucose Bld gHb Est-mCnc: 120 mg/dL
Hgb A1c MFr Bld: 5.8 % — ABNORMAL HIGH (ref 4.8–5.6)

## 2020-06-02 LAB — MAGNESIUM: Magnesium: 2.1 mg/dL (ref 1.6–2.3)

## 2020-06-02 LAB — PSA TOTAL (REFLEX TO FREE): Prostate Specific Ag, Serum: 2.3 ng/mL (ref 0.0–4.0)

## 2020-06-20 NOTE — Progress Notes (Signed)
Subjective:   Leonard Hughes is a 75 y.o. male who presents for an Initial Medicare Annual Wellness Visit.  I connected with Leonard Hughes today by telephone and verified that I am speaking with the correct person using two identifiers. Location patient: home Location provider: work Persons participating in the virtual visit: patient, provider.   I discussed the limitations, risks, security and privacy concerns of performing an evaluation and management service by telephone and the availability of in person appointments. I also discussed with the patient that there may be a patient responsible charge related to this service. The patient expressed understanding and verbally consented to this telephonic visit.    Interactive audio and video telecommunications were attempted between this provider and patient, however failed, due to patient having technical difficulties OR patient did not have access to video capability.  We continued and completed visit with audio only.   Review of Systems    N/A  Cardiac Risk Factors include: advanced age (>70men, >52 women);dyslipidemia;male gender;hypertension     Objective:    There were no vitals filed for this visit. There is no height or weight on file to calculate BMI.  Advanced Directives 06/21/2020 12/20/2017 12/01/2017  Does Patient Have a Medical Advance Directive? Yes No Yes  Type of Estate agent of [place];Living will - -  Copy of Healthcare Power of Attorney in Chart? No - copy requested - -  Would patient like information on creating a medical advance directive? - No - Patient declined -    Current Medications (verified) Outpatient Encounter Medications as of 06/21/2020  Medication Sig  . Alpha-Lipoic Acid 600 MG CAPS Take 600 mg by mouth 2 (two) times daily.  Marland Kitchen aspirin 81 MG EC tablet Take 81 mg by mouth daily.  Marland Kitchen atorvastatin (LIPITOR) 40 MG tablet Take 1 tablet (40 mg total) by mouth daily.  .  cholecalciferol (VITAMIN D3) 25 MCG (1000 UNIT) tablet Take 1,000 Units by mouth daily.  . clopidogrel (PLAVIX) 75 MG tablet TAKE 1 TABLET (75 MG TOTAL) BY MOUTH DAILY.  Marland Kitchen ezetimibe (ZETIA) 10 MG tablet TAKE 1 TABLET EVERY DAY  . losartan (COZAAR) 50 MG tablet TAKE 1 TABLET (50 MG TOTAL) BY MOUTH DAILY.  . metoprolol succinate (TOPROL-XL) 25 MG 24 hr tablet Take 1 tablet (25 mg total) by mouth daily.  . Multiple Vitamin (MULTIVITAMIN) tablet Take 1 tablet by mouth daily.  . Omega-3 Fatty Acids (FISH OIL) 1200 MG CAPS Take by mouth daily at 6 (six) AM.  . omeprazole (PRILOSEC) 20 MG capsule TAKE 1 CAPSULE EVERY DAY   No facility-administered encounter medications on file as of 06/21/2020.    Allergies (verified) Crestor [rosuvastatin]   History: Past Medical History:  Diagnosis Date  . Coronary artery disease 2007   CABG 2v Dorris Fetch)  . History of chicken pox   . Hyperlipidemia   . Hypertension    Past Surgical History:  Procedure Laterality Date  . COLONOSCOPY WITH PROPOFOL N/A 12/01/2017   Procedure: COLONOSCOPY WITH PROPOFOL;  Surgeon: Midge Minium, MD;  [facility]: [facility] ENDOSCOPY;  Service: Endoscopy;  Laterality: N/A;  . CORONARY ANGIOPLASTY    . CORONARY ARTERY BYPASS GRAFT  2007   2 vessel, [place]  . PERCUTANEOUS CORONARY STENT INTERVENTION (PCI-S)  2001   mid L circ  . TONSILLECTOMY     Family History  Problem Relation Age of Onset  . Heart disease Mother        ICD and stents  . Stroke  Mother        possibly  . CAD Father 32       MI  . CAD Sister        triple bypass  . CAD Brother 53  . Diabetes Neg Hx   . Cancer Neg Hx    Social History   Socioeconomic History  . Marital status: Married    Spouse name: Not on file  . Number of children: 1  . Years of education: Not on file  . Highest education level: Bachelor's degree (e.g., BA, AB, BS)  Occupational History  . Not on file  Tobacco Use  . Smoking status: Former Smoker    Packs/day: 1.00     Years: 3.00    Pack years: 3.00    Types: Cigarettes    Quit date: 08/26/1979    Years since quitting: 40.8  . Smokeless tobacco: Never Used  Vaping Use  . Vaping Use: Never used  Substance and Sexual Activity  . Alcohol use: Yes    Alcohol/week: 2.0 - 3.0 standard drinks    Types: 2 - 3 Standard drinks or equivalent per week    Comment: any kind of alcohol  . Drug use: No  . Sexual activity: Not on file  Other Topics Concern  . Not on file  Social History Narrative   Caffeine: 2-3 cups coffee/day   Lives with wife, mother in law, cats and dog.  grown daughter   Occupation: Airline pilot, Scientist, water quality   Edu: BA at [employer]   Activity: walking 2 mi 5d/wk   Diet: good water, fruits/vegetables daily   Social Determinants of Health   Financial Resource Strain: Low Risk   . Difficulty of Paying Living Expenses: Not hard at all  Food Insecurity: No Food Insecurity  . Worried About Programme researcher, broadcasting/film/video in the Last Year: Never true  . Ran Out of Food in the Last Year: Never true  Transportation Needs: No Transportation Needs  . Lack of Transportation (Medical): No  . Lack of Transportation (Non-Medical): No  Physical Activity: Sufficiently Active  . Days of Exercise per Week: 7 days  . Minutes of Exercise per Session: 30 min  Stress: No Stress Concern Present  . Feeling of Stress : Not at all  Social Connections: Moderately Integrated  . Frequency of Communication with Friends and Family: More than three times a week  . Frequency of Social Gatherings with Friends and Family: More than three times a week  . Attends Religious Services: More than 4 times per year  . Active Member of Clubs or Organizations: No  . Attends Banker Meetings: Never  . Marital Status: Married    Tobacco Counseling Counseling given: Not Answered   Clinical Intake:  Pre-visit preparation completed: Yes  Pain : No/denies pain     Nutritional Risks: None Diabetes: No  How often do you need  to have someone help you when you read instructions, pamphlets, or other written materials from your doctor or pharmacy?: 1 - Never  Diabetic? No  Interpreter Needed?: No  Information entered by :: [facility], LPN   Activities of Daily Living In your present state of health, do you have any difficulty performing the following activities: 06/21/2020 06/01/2020  Hearing? N N  Vision? N N  Difficulty concentrating or making decisions? N N  Walking or climbing stairs? N N  Dressing or bathing? N N  Doing errands, shopping? N N  Preparing Food and eating ? N -  Using the Toilet? N -  In the past six months, have you accidently leaked urine? N -  Do you have problems with loss of bowel control? N -  Managing your Medications? N -  Managing your Finances? N -  Housekeeping or managing your Housekeeping? N -  Some recent data might be hidden    Patient Care Team: Malva Limes, MD as PCP - General (Family Medicine) Mariah Milling Tollie Pizza, MD as Consulting Physician (Cardiology) System, Provider Not In (Ophthalmology)  Indicate any recent Medical Services you may have received from other than [facility] providers in the past year (date may be approximate).     Assessment:   This is a routine wellness examination for [place].  Hearing/Vision screen No exam data present  Dietary issues and exercise activities discussed: Current Exercise Habits: Home exercise routine, Type of exercise: walking, Time (Minutes): 30, Frequency (Times/Week): 7, Weekly Exercise (Minutes/Week): 210, Intensity: Mild, Exercise limited by: None identified  Goals    . DIET - INCREASE WATER INTAKE     Recommend to drink at least 6-8 8oz glasses of water per day.      Depression Screen PHQ 2/9 Scores 06/21/2020 06/01/2020 10/02/2017 09/29/2016 06/28/2012  PHQ - 2 Score 0 0 0 0 2  PHQ- 9 Score - 0 0 - 4    Fall Risk Fall Risk  06/21/2020 06/01/2020 10/02/2017 09/29/2016  Falls in the past year? 0 0 No No  Number falls in past  yr: 0 0 - -  Injury with Fall? 0 0 - -  Risk for fall due to : - No Fall Risks - -  Follow up - Falls evaluation completed - -    FALL RISK PREVENTION PERTAINING TO THE HOME:  Any stairs in or around the home? Yes  If so, are there any without handrails? No  Home free of loose throw rugs in walkways, pet beds, electrical cords, etc? Yes  Adequate lighting in your home to reduce risk of falls? Yes   ASSISTIVE DEVICES UTILIZED TO PREVENT FALLS:  Life alert? No  Use of a cane, walker or w/c? No  Grab bars in the bathroom? No  Shower chair or bench in shower? Yes  Elevated toilet seat or a handicapped toilet? No    Cognitive Function: Normal cognitive status assessed by observation by this Nurse Health Advisor. No abnormalities found.       6CIT Screen 06/01/2020  What Year? 0 points  What month? 0 points  What time? 0 points  Count back from 20 0 points  Months in reverse 0 points  Repeat phrase 0 points  Total Score 0    Immunizations Immunization History  Administered Date(s) Administered  . Fluad Quad(high Dose 65+) 03/28/2019, 03/27/2020, 03/27/2020  . Influenza Split 06/28/2012  . Influenza, High Dose Seasonal PF 04/07/2018  . Influenza,inj,Quad PF,6+ Mos 06/28/2013, 07/03/2014, 03/13/2016, 03/24/2017  . Moderna Sars-Covid-2 Vaccination 07/08/2019, 08/05/2019, 04/16/2020  . Pneumococcal Conjugate-13 07/03/2014  . Pneumococcal Polysaccharide-23 06/28/2013  . Td 05/26/2005  . Tdap 09/24/2015  . Zoster 09/24/2015    TDAP status: Up to date  Flu Vaccine status: Up to date  Pneumococcal vaccine status: Up to date  Covid-19 vaccine status: Completed vaccines  Qualifies for Shingles Vaccine? Yes   Zostavax completed Yes   Shingrix Completed?: No.    Education has been provided regarding the importance of this vaccine. Patient has been advised to call insurance company to determine out of pocket expense if they have not  yet received this vaccine. Advised may also  receive vaccine at local pharmacy or Health Dept. Verbalized acceptance and understanding.  Screening Tests Health Maintenance  Topic Date Due  . TETANUS/TDAP  09/23/2025  . COLONOSCOPY (Pts 45-40yrs Insurance coverage will need to be confirmed)  12/02/2027  . INFLUENZA VACCINE  Completed  . COVID-19 Vaccine  Completed  . Hepatitis C Screening  Completed  . PNA vac Low Risk Adult  Completed    Health Maintenance  There are no preventive care reminders to display for this patient.  Colorectal cancer screening: Type of screening: Colonoscopy. Completed 12/01/17. Repeat every 10 years  Lung Cancer Screening: (Low Dose CT Chest recommended if Age 18-80 years, 30 pack-year currently smoking OR have quit w/in 15years.) does not qualify.   Additional Screening:  Hepatitis C Screening: Up to date  Vision Screening: Recommended annual ophthalmology exams for early detection of glaucoma and other disorders of the eye. Is the patient up to date with their annual eye exam?  Yes  Who is the provider or what is the name of the office in which the patient attends annual eye exams? [facility] If pt is not established with a provider, would they like to be referred to a provider to establish care? No .   Dental Screening: Recommended annual dental exams for proper oral hygiene  Community Resource Referral / Chronic Care Management: CRR required this visit?  No   CCM required this visit?  No      Plan:     I have personally reviewed and noted the following in the patient's chart:   . Medical and social history . Use of alcohol, tobacco or illicit drugs  . Current medications and supplements . Functional ability and status . Nutritional status . Physical activity . Advanced directives . List of other physicians . Hospitalizations, surgeries, and ER visits in previous 12 months . Vitals . Screenings to include cognitive, depression, and falls . Referrals and appointments  In  addition, I have reviewed and discussed with patient certain preventive protocols, quality metrics, and best practice recommendations. A written personalized care plan for preventive services as well as general preventive health recommendations were provided to patient.     Kaityln Kallstrom [place], [place]   [id number][id number]   Nurse Notes: None.

## 2020-06-21 ENCOUNTER — Ambulatory Visit (INDEPENDENT_AMBULATORY_CARE_PROVIDER_SITE_OTHER): Payer: Medicare HMO

## 2020-06-21 ENCOUNTER — Other Ambulatory Visit: Payer: Self-pay

## 2020-06-21 ENCOUNTER — Other Ambulatory Visit: Payer: Self-pay | Admitting: Cardiovascular Disease

## 2020-06-21 DIAGNOSIS — Z0000 Encounter for general adult medical examination without abnormal findings: Secondary | ICD-10-CM

## 2020-06-21 NOTE — Patient Instructions (Signed)
[name] , Thank you for taking time to come for your Medicare Wellness Visit. I appreciate your ongoing commitment to your health goals. Please review the following plan we discussed and let me know if I can assist you in the future.   Screening recommendations/referrals: Colonoscopy: Up to date, due 11/2027 Recommended yearly ophthalmology/optometry visit for glaucoma screening and checkup Recommended yearly dental visit for hygiene and checkup  Vaccinations: Influenza vaccine: Done 09/2025 Pneumococcal vaccine: Completed series Tdap vaccine: Up to date, due 09/2025 Shingles vaccine: Shingrix discussed. Please contact your pharmacy for coverage information.     Advanced directives: Please bring a copy of your POA (Power of Attorney) and/or Living Will to your next appointment.   Conditions/risks identified: Recommend to drink at least 6-8 8oz glasses of water per day.  Next appointment: 06/03/21 @ 9:00 AM with Dr Leonard Hughes  Preventive Care 65 Years and Older, Male Preventive care refers to lifestyle choices and visits with your health care provider that can promote health and wellness. What does preventive care include?  A yearly physical exam. This is also called an annual well check.  Dental exams once or twice a year.  Routine eye exams. Ask your health care provider how often you should have your eyes checked.  Personal lifestyle choices, including:  Daily care of your teeth and gums.  Regular physical activity.  Eating a healthy diet.  Avoiding tobacco and drug use.  Limiting alcohol use.  Practicing safe sex.  Taking low doses of aspirin every day.  Taking vitamin and mineral supplements as recommended by your health care provider. What happens during an annual well check? The services and screenings done by your health care provider during your annual well check will depend on your age, overall health, lifestyle risk factors, and family history of  disease. Counseling  Your health care provider may ask you questions about your:  Alcohol use.  Tobacco use.  Drug use.  Emotional well-being.  Home and relationship well-being.  Sexual activity.  Eating habits.  History of falls.  Memory and ability to understand (cognition).  Work and work Astronomer. Screening  You may have the following tests or measurements:  Height, weight, and BMI.  Blood pressure.  Lipid and cholesterol levels. These may be checked every 5 years, or more frequently if you are over 66 years old.  Skin check.  Lung cancer screening. You may have this screening every year starting at age 48 if you have a 30-pack-year history of smoking and currently smoke or have quit within the past 15 years.  Fecal occult blood test (FOBT) of the stool. You may have this test every year starting at age 53.  Flexible sigmoidoscopy or colonoscopy. You may have a sigmoidoscopy every 5 years or a colonoscopy every 10 years starting at age 58.  Prostate cancer screening. Recommendations will vary depending on your family history and other risks.  Hepatitis C blood test.  Hepatitis B blood test.  Sexually transmitted disease (STD) testing.  Diabetes screening. This is done by checking your blood sugar (glucose) after you have not eaten for a while (fasting). You may have this done every 1-3 years.  Abdominal aortic aneurysm (AAA) screening. You may need this if you are a current or former smoker.  Osteoporosis. You may be screened starting at age 52 if you are at high risk. Talk with your health care provider about your test results, treatment options, and if necessary, the need for more tests. Vaccines  Your  health care provider may recommend certain vaccines, such as:  Influenza vaccine. This is recommended every year.  Tetanus, diphtheria, and acellular pertussis (Tdap, Td) vaccine. You may need a Td booster every 10 years.  Zoster vaccine. You may  need this after age 61.  Pneumococcal 13-valent conjugate (PCV13) vaccine. One dose is recommended after age 47.  Pneumococcal polysaccharide (PPSV23) vaccine. One dose is recommended after age 73. Talk to your health care provider about which screenings and vaccines you need and how often you need them. This information is not intended to replace advice given to you by your health care provider. Make sure you discuss any questions you have with your health care provider. Document Released: 06/08/2015 Document Revised: 01/30/2016 Document Reviewed: 03/13/2015 Elsevier Interactive Patient Education  2017 [place] Prevention in the Home Falls can cause injuries. They can happen to people of all ages. There are many things you can do to make your home safe and to help prevent falls. What can I do on the outside of my home?  Regularly fix the edges of walkways and driveways and fix any cracks.  Remove anything that might make you trip as you walk through a door, such as a raised step or threshold.  Trim any bushes or trees on the path to your home.  Use bright outdoor lighting.  Clear any walking paths of anything that might make someone trip, such as rocks or tools.  Regularly check to see if handrails are loose or broken. Make sure that both sides of any steps have handrails.  Any raised decks and porches should have guardrails on the edges.  Have any leaves, snow, or ice cleared regularly.  Use sand or salt on walking paths during winter.  Clean up any spills in your garage right away. This includes oil or grease spills. What can I do in the bathroom?  Use night lights.  Install grab bars by the toilet and in the tub and shower. Do not use towel bars as grab bars.  Use non-skid mats or decals in the tub or shower.  If you need to sit down in the shower, use a plastic, non-slip stool.  Keep the floor dry. Clean up any water that spills on the floor as soon as it  happens.  Remove soap buildup in the tub or shower regularly.  Attach bath mats securely with double-sided non-slip rug tape.  Do not have throw rugs and other things on the floor that can make you trip. What can I do in the bedroom?  Use night lights.  Make sure that you have a light by your bed that is easy to reach.  Do not use any sheets or blankets that are too big for your bed. They should not hang down onto the floor.  Have a firm chair that has side arms. You can use this for support while you get dressed.  Do not have throw rugs and other things on the floor that can make you trip. What can I do in the kitchen?  Clean up any spills right away.  Avoid walking on wet floors.  Keep items that you use a lot in easy-to-reach places.  If you need to reach something above you, use a strong step stool that has a grab bar.  Keep electrical cords out of the way.  Do not use floor polish or wax that makes floors slippery. If you must use wax, use non-skid floor wax.  Do not  have throw rugs and other things on the floor that can make you trip. What can I do with my stairs?  Do not leave any items on the stairs.  Make sure that there are handrails on both sides of the stairs and use them. Fix handrails that are broken or loose. Make sure that handrails are as long as the stairways.  Check any carpeting to make sure that it is firmly attached to the stairs. Fix any carpet that is loose or worn.  Avoid having throw rugs at the top or bottom of the stairs. If you do have throw rugs, attach them to the floor with carpet tape.  Make sure that you have a light switch at the top of the stairs and the bottom of the stairs. If you do not have them, ask someone to add them for you. What else can I do to help prevent falls?  Wear shoes that:  Do not have high heels.  Have rubber bottoms.  Are comfortable and fit you well.  Are closed at the toe. Do not wear sandals.  If you  use a stepladder:  Make sure that it is fully opened. Do not climb a closed stepladder.  Make sure that both sides of the stepladder are locked into place.  Ask someone to hold it for you, if possible.  Clearly mark and make sure that you can see:  Any grab bars or handrails.  First and last steps.  Where the edge of each step is.  Use tools that help you move around (mobility aids) if they are needed. These include:  Canes.  Walkers.  Scooters.  Crutches.  Turn on the lights when you go into a dark area. Replace any light bulbs as soon as they burn out.  Set up your furniture so you have a clear path. Avoid moving your furniture around.  If any of your floors are uneven, fix them.  If there are any pets around you, be aware of where they are.  Review your medicines with your doctor. Some medicines can make you feel dizzy. This can increase your chance of falling. Ask your doctor what other things that you can do to help prevent falls. This information is not intended to replace advice given to you by your health care provider. Make sure you discuss any questions you have with your health care provider. Document Released: 03/08/2009 Document Revised: 10/18/2015 Document Reviewed: 06/16/2014 Elsevier Interactive Patient Education  2017 [employer].

## 2020-07-02 DIAGNOSIS — R69 Illness, unspecified: Secondary | ICD-10-CM | POA: Diagnosis not present

## 2020-07-05 ENCOUNTER — Other Ambulatory Visit: Payer: Self-pay | Admitting: Cardiovascular Disease

## 2020-09-10 ENCOUNTER — Telehealth: Payer: Self-pay

## 2020-09-10 DIAGNOSIS — K219 Gastro-esophageal reflux disease without esophagitis: Secondary | ICD-10-CM

## 2020-09-10 MED ORDER — OMEPRAZOLE 20 MG PO CPDR: 1.0000 | DELAYED_RELEASE_CAPSULE | Freq: Every day | ORAL | 1 refills | Status: DC

## 2020-09-10 NOTE — Telephone Encounter (Signed)
[facility] faxed refill request for the following medications:  omeprazole (PRILOSEC) 20 MG capsule  Please advise. 

## 2020-11-14 ENCOUNTER — Other Ambulatory Visit: Payer: Self-pay | Admitting: Cardiovascular Disease

## 2021-02-10 ENCOUNTER — Other Ambulatory Visit: Payer: Self-pay | Admitting: Cardiovascular Disease

## 2021-02-11 ENCOUNTER — Other Ambulatory Visit: Payer: Self-pay | Admitting: *Deleted

## 2021-02-11 MED ORDER — EZETIMIBE 10 MG PO TABS: 10.0000 mg | ORAL_TABLET | Freq: Every day | ORAL | 1 refills | Status: DC

## 2021-02-20 ENCOUNTER — Other Ambulatory Visit: Payer: Self-pay | Admitting: Cardiovascular Disease

## 2021-03-12 ENCOUNTER — Other Ambulatory Visit: Payer: Self-pay | Admitting: Family Medicine

## 2021-03-12 DIAGNOSIS — K219 Gastro-esophageal reflux disease without esophagitis: Secondary | ICD-10-CM

## 2021-03-12 NOTE — Telephone Encounter (Signed)
Requested Prescriptions  Pending Prescriptions Disp Refills  . omeprazole (PRILOSEC) 20 MG capsule [Pharmacy Med Name: OMEPRAZOLE 20 MG Capsule Delayed Release] 90 capsule 1    Sig: TAKE 1 CAPSULE EVERY DAY     Gastroenterology: Proton Pump Inhibitors Passed - 03/12/2021  8:04 AM      Passed - Valid encounter within last 12 months    Recent Outpatient Visits          9 months ago Annual physical exam   [facility] Malva Limes, MD   3 years ago Acute left-sided low back pain without sciatica   [facility] Malva Limes, MD   3 years ago Annual physical exam   [facility] Malva Limes, MD      Future Appointments            In 2 weeks Gollan, Tollie Pizza, MD [facility], [facility]   In 2 months Fisher, Demetrios Isaacs, MD [facility], [facility]

## 2021-03-27 ENCOUNTER — Ambulatory Visit: Payer: Medicare HMO | Admitting: Cardiovascular Disease

## 2021-03-27 ENCOUNTER — Encounter: Payer: Self-pay | Admitting: Cardiovascular Disease

## 2021-03-27 ENCOUNTER — Other Ambulatory Visit: Payer: Self-pay

## 2021-03-27 VITALS — BP 160/70 | HR 57 | Ht 71.0 in | Wt 211.5 lb

## 2021-03-27 DIAGNOSIS — I10 Essential (primary) hypertension: Secondary | ICD-10-CM | POA: Diagnosis not present

## 2021-03-27 DIAGNOSIS — Z23 Encounter for immunization: Secondary | ICD-10-CM | POA: Diagnosis not present

## 2021-03-27 DIAGNOSIS — R011 Cardiac murmur, unspecified: Secondary | ICD-10-CM

## 2021-03-27 DIAGNOSIS — I739 Peripheral vascular disease, unspecified: Secondary | ICD-10-CM

## 2021-03-27 DIAGNOSIS — I25708 Atherosclerosis of coronary artery bypass graft(s), unspecified, with other forms of angina pectoris: Secondary | ICD-10-CM | POA: Diagnosis not present

## 2021-03-27 DIAGNOSIS — I2510 Atherosclerotic heart disease of native coronary artery without angina pectoris: Secondary | ICD-10-CM

## 2021-03-27 DIAGNOSIS — Z951 Presence of aortocoronary bypass graft: Secondary | ICD-10-CM | POA: Diagnosis not present

## 2021-03-27 DIAGNOSIS — E782 Mixed hyperlipidemia: Secondary | ICD-10-CM

## 2021-03-27 DIAGNOSIS — I6523 Occlusion and stenosis of bilateral carotid arteries: Secondary | ICD-10-CM | POA: Diagnosis not present

## 2021-03-27 MED ORDER — METOPROLOL SUCCINATE ER 25 MG PO TB24: 25.0000 mg | ORAL_TABLET | Freq: Every day | ORAL | 3 refills | Status: DC

## 2021-03-27 MED ORDER — CLOPIDOGREL BISULFATE 75 MG PO TABS: 75.0000 mg | ORAL_TABLET | Freq: Every day | ORAL | 3 refills | Status: DC

## 2021-03-27 MED ORDER — ATORVASTATIN CALCIUM 40 MG PO TABS: 40.0000 mg | ORAL_TABLET | Freq: Every day | ORAL | 3 refills | Status: AC

## 2021-03-27 MED ORDER — LOSARTAN POTASSIUM 50 MG PO TABS: 50.0000 mg | ORAL_TABLET | Freq: Every day | ORAL | 3 refills | Status: DC

## 2021-03-27 NOTE — Patient Instructions (Addendum)
Medication Instructions:  No changes  If you need a refill on your cardiac medications before your next appointment, please call your pharmacy.   Lab work: No new labs needed  Testing/Procedures: ECHO Your physician has requested that you have an echocardiogram. Echocardiography is a painless test that uses sound waves to create images of your heart. It provides your doctor with information about the size and shape of your heart and how well your heart's chambers and valves are working. This procedure takes approximately one hour. There are no restrictions for this procedure.  There is a possibility that an IV may need to be started during your test to inject an image enhancing agent. This is done to obtain more optimal pictures of your heart. Therefore we ask that you do at least drink some water prior to coming in to hydrate your veins.     Follow-Up: At [facility], you and your health needs are our priority.  As part of our continuing mission to provide you with exceptional heart care, we have created designated Provider Care Teams.  These Care Teams include your primary Cardiologist (physician) and Advanced Practice Providers (APPs -  Physician Assistants and Nurse Practitioners) who all work together to provide you with the care you need, when you need it.  You will need a follow up appointment in 12 months  Providers on your designated Care Team:   Nicolasa Ducking, NP Eula Listen, PA-C Cadence Fransico Aidenjames, [place]  COVID-19 Vaccine Information can be found at: [email or web address] For questions related to vaccine distribution or appointments, please email [email or web address] or call [phone].   Please monitor blood pressures and keep a log of your readings.  Call the clinic in 2 weeks with BP readings or post to your MyChart.  How to use a home blood pressure monitor. Be still. Measure at the same time every day. It's  important to take the readings at the same time each day, such as morning and evening. Take reading approximately 1 1/2 to 2 hours after BP medications.   AVOID these things for 30 minutes before checking your blood pressure: Drinking caffeine. Drinking alcohol. Eating. Smoking. Exercising.

## 2021-03-27 NOTE — Progress Notes (Signed)
Cardiology Office Note  Date:  03/27/2021   ID:  Leonard Hughes, DOB [birth year], MRN [id number]  PCP:  Malva Limes, MD   Chief Complaint  Patient presents with   12 month follow up     "Doing well." Medications reviewed by the patient verbally.      HPI:  [name] is a 75 year old gentleman with past medical history of  coronary artery disease,  severe proximal LAD disease in 2001 with stent placed at that time,  bypass surgery by Dr. Dorris Fetch in March of 2007  for 75% left main disease,  obesity,  Carotid stenosis 1-39% RICA, 40-59% LICA stenosis in 10/2016 who presents for follow up of his coronary artery disease  Last office visit with myself in over 2021  Significant stressors, wife with medical issues and [place] [facility] 2 miles daily  Denies any shortness of breath or chest pain on exertion Occasional atypical chest discomfort at rest, not on a regular basis  Continues to be troubled by neuropathy toes, feet, legs Attributes this to statins but does not want to stop them Previously held Lipitor, had no change in symptoms  Lab work reviewed A1c 5.8 Total cholesterol 126 LDL 63  EKG personally reviewed by myself on todays visit Shows normal sinus rhythm rate 57 bpm no significant ST or T wave changes  Other past medical history  stress tests have come back positive both in 2001 and in  2007. 2001-lead to a stent in 2007, his bypass surgery.     PMH:   has a past medical history of Coronary artery disease (2007), History of chicken pox, Hyperlipidemia, and Hypertension.  PSH:    Past Surgical History:  Procedure Laterality Date   COLONOSCOPY WITH PROPOFOL N/A 12/01/2017   Procedure: COLONOSCOPY WITH PROPOFOL;  Surgeon: Midge Minium, MD;  Location: [facility] ENDOSCOPY;  Service: Endoscopy;  Laterality: N/A;   CORONARY ANGIOPLASTY     CORONARY ARTERY BYPASS GRAFT  2007   2 vessel, [place]   PERCUTANEOUS CORONARY STENT INTERVENTION (PCI-S)  2001   mid L  circ   TONSILLECTOMY      Current Outpatient Medications  Medication Sig Dispense Refill   aspirin 81 MG EC tablet Take 81 mg by mouth daily.     ezetimibe (ZETIA) 10 MG tablet Take 1 tablet (10 mg total) by mouth daily. 90 tablet 1   Multiple Vitamin (MULTIVITAMIN) tablet Take 1 tablet by mouth daily.     Omega-3 Fatty Acids (FISH OIL) 1200 MG CAPS Take by mouth daily at 6 (six) AM.     omeprazole (PRILOSEC) 20 MG capsule TAKE 1 CAPSULE EVERY DAY 90 capsule 1   atorvastatin (LIPITOR) 40 MG tablet Take 1 tablet (40 mg total) by mouth daily. 90 tablet 3   clopidogrel (PLAVIX) 75 MG tablet Take 1 tablet (75 mg total) by mouth daily. 90 tablet 3   losartan (COZAAR) 50 MG tablet Take 1 tablet (50 mg total) by mouth daily. 90 tablet 3   metoprolol succinate (TOPROL-XL) 25 MG 24 hr tablet Take 1 tablet (25 mg total) by mouth daily. Keep appt with Dr. Mariah Milling 11/2 for additional refills 90 tablet 3   No current facility-administered medications for this visit.    Allergies:   Crestor [rosuvastatin]   Social History:  The patient  reports that he quit smoking about 41 years ago. His smoking use included cigarettes. He has a 3.00 pack-year smoking history. He has never used smokeless tobacco. He reports current  alcohol use of about 2.0 - 3.0 standard drinks per week. He reports that he does not use drugs.   Family History:   family history includes CAD in his sister; CAD (age of onset: 82) in his brother; CAD (age of onset: 16) in his father; Heart disease in his mother; Stroke in his mother.    Review of Systems: Review of Systems  Constitutional: Negative.   Respiratory: Negative.    Cardiovascular: Negative.   Gastrointestinal: Negative.   Musculoskeletal: Negative.   Neurological: Negative.        Numbness in his feet/toes  Psychiatric/Behavioral: Negative.    All other systems reviewed and are negative.  PHYSICAL EXAM: VS:  BP (!) 160/70 (BP Location: Left Arm, Patient Position:  Sitting, Cuff Size: Normal)   Pulse (!) 57   Ht 5\' 11"  (1.803 m)   Wt 211 lb 8 oz (95.9 kg)   SpO2 98%   BMI 29.50 kg/m  , BMI Body mass index is 29.5 kg/m.  Constitutional:  oriented to person, place, and time. No distress.  HENT:  Head: Grossly normal Eyes:  no discharge. No scleral icterus.  Neck: No JVD, no carotid bruits  Cardiovascular: Regular rate and rhythm, 2/6 systolic ejection murmur appreciated right sternal border Pulmonary/Chest: Clear to auscultation bilaterally, no wheezes or rails Abdominal: Soft.  no distension.  no tenderness.  Musculoskeletal: Normal range of motion Neurological:  normal muscle tone. Coordination normal. No atrophy Skin: Skin warm and dry Psychiatric: normal affect, pleasant  Recent Labs: 06/01/2020: Magnesium 2.1    Lipid Panel Lab Results  Component Value Date   CHOL 126 03/20/2020   HDL 37 (L) 03/20/2020   LDLCALC 63 03/20/2020   TRIG 130 03/20/2020      Wt Readings from Last 3 Encounters:  03/27/21 211 lb 8 oz (95.9 kg)  06/01/20 218 lb (98.9 kg)  03/27/20 216 lb (98 kg)      ASSESSMENT AND PLAN:  HYPERTENSION, BENIGN -  Blood pressure elevated on today's visit as on last clinic visit Remains elevated even on my recheck Recommended he start closely monitoring blood pressure at home and call 13/02/21 with some numbers  Aortic valve disorder Prominent murmur appreciated on today's visit, echocardiogram ordered Discussed aortic valve stenosis management  Atherosclerosis of coronary artery bypass graft of native heart without angina pectoris -  Currently with no symptoms of angina. No further workup at this time. Continue current medication regimen. Continues to walk 2 miles a day with no significant symptoms  Paresthesia Numbness in his feet Previously seen by podiatry [place] for idiopathic neuropathy  Pure hypercholesterolemia Cholesterol is at goal on the current lipid regimen. No changes to the medications were  made.  Elevated glucose Diet controlled elevated glucose   Total encounter time more than 25 minutes  Greater than 50% was spent in counseling and coordination of care with the patient    Orders Placed This Encounter  Procedures   Flu Vaccine QUAD High Dose(Fluad)   EKG 12-Lead   ECHOCARDIOGRAM COMPLETE      Signed, [place], M.D., Ph.D. 03/27/2021  [facility] [place], [place]

## 2021-04-07 ENCOUNTER — Other Ambulatory Visit: Payer: Self-pay | Admitting: Cardiovascular Disease

## 2021-04-12 DIAGNOSIS — H524 Presbyopia: Secondary | ICD-10-CM | POA: Diagnosis not present

## 2021-04-24 ENCOUNTER — Other Ambulatory Visit: Payer: Self-pay | Admitting: Cardiovascular Disease

## 2021-05-02 ENCOUNTER — Other Ambulatory Visit: Payer: Self-pay

## 2021-05-02 ENCOUNTER — Ambulatory Visit (INDEPENDENT_AMBULATORY_CARE_PROVIDER_SITE_OTHER): Payer: Medicare HMO

## 2021-05-02 DIAGNOSIS — R011 Cardiac murmur, unspecified: Secondary | ICD-10-CM | POA: Diagnosis not present

## 2021-05-02 DIAGNOSIS — I2510 Atherosclerotic heart disease of native coronary artery without angina pectoris: Secondary | ICD-10-CM | POA: Diagnosis not present

## 2021-05-02 DIAGNOSIS — Z951 Presence of aortocoronary bypass graft: Secondary | ICD-10-CM

## 2021-05-02 LAB — ECHOCARDIOGRAM COMPLETE
AR max vel: 1.26 cm2
AV Area VTI: 1.16 cm2
AV Area mean vel: 1.24 cm2
AV Mean grad: 13.3 mmHg
AV Peak grad: 21.7 mmHg
Ao pk vel: 2.33 m/s
Area-P 1/2: 2.91 cm2
S' Lateral: 2.7 cm
Single Plane A4C EF: 56.8 %

## 2021-05-02 MED ORDER — PERFLUTREN LIPID MICROSPHERE
1.0000 mL | INTRAVENOUS | Status: AC | PRN
  Administered 2021-05-02: 2 mL via INTRAVENOUS

## 2021-05-07 ENCOUNTER — Telehealth: Payer: Self-pay

## 2021-05-07 NOTE — Telephone Encounter (Signed)
Left detail message on VM of pt's recent results okay by DPR, Dr. Mariah Milling advised   "Echo  Normal ejection fraction  Moderate dilation left atrium  Mild aortic valve stenosis  Will need periodic monitoring with echo every several years "  At this time, no further recommendations or medications changes, advised to call office for any concerns or questions, otherwise will see at next visit.

## 2021-06-03 ENCOUNTER — Encounter: Payer: Medicare HMO | Admitting: Family Medicine

## 2021-06-27 ENCOUNTER — Ambulatory Visit (INDEPENDENT_AMBULATORY_CARE_PROVIDER_SITE_OTHER): Payer: Medicare HMO

## 2021-06-27 DIAGNOSIS — Z0000 Encounter for general adult medical examination without abnormal findings: Secondary | ICD-10-CM

## 2021-06-27 NOTE — Progress Notes (Signed)
Virtual Visit via Telephone Note  I connected with  Leonard Hughes on 06/27/21 at  8:20 AM EST by telephone and verified that I am speaking with the correct person using two identifiers.  Location: Patient: home Provider: [facility] Persons participating in the virtual visit: patient/Nurse Health Advisor   I discussed the limitations, risks, security and privacy concerns of performing an evaluation and management service by telephone and the availability of in person appointments. The patient expressed understanding and agreed to proceed.  Interactive audio and video telecommunications were attempted between this nurse and patient, however failed, due to patient having technical difficulties OR patient did not have access to video capability.  We continued and completed visit with audio only.  Some vital signs may be absent or patient reported.   Leonard Hope, LPN  Subjective:   Leonard Hughes is a 76 y.o. male who presents for Medicare Annual/Subsequent preventive examination.  Review of Systems           Objective:    There were no vitals filed for this visit. There is no height or weight on file to calculate BMI.  Advanced Directives 06/21/2020 12/20/2017 12/01/2017  Does Patient Have a Medical Advance Directive? Yes No Yes  Type of Estate agent of [place];Living will - -  Copy of Healthcare Power of Attorney in Chart? No - copy requested - -  Would patient like information on creating a medical advance directive? - No - Patient declined -    Current Medications (verified) Outpatient Encounter Medications as of 06/27/2021  Medication Sig   aspirin 81 MG EC tablet Take 81 mg by mouth daily.   atorvastatin (LIPITOR) 40 MG tablet Take 1 tablet (40 mg total) by mouth daily.   clopidogrel (PLAVIX) 75 MG tablet TAKE 1 TABLET EVERY DAY   ezetimibe (ZETIA) 10 MG tablet Take 1 tablet (10 mg total) by mouth daily.   losartan (COZAAR) 50 MG tablet TAKE 1 TABLET  EVERY DAY   metoprolol succinate (TOPROL-XL) 25 MG 24 hr tablet Take 1 tablet (25 mg total) by mouth daily.   Multiple Vitamin (MULTIVITAMIN) tablet Take 1 tablet by mouth daily.   Omega-3 Fatty Acids (FISH OIL) 1200 MG CAPS Take by mouth daily at 6 (six) AM.   omeprazole (PRILOSEC) 20 MG capsule TAKE 1 CAPSULE EVERY DAY   No facility-administered encounter medications on file as of 06/27/2021.    Allergies (verified) Crestor [rosuvastatin]   History: Past Medical History:  Diagnosis Date   Coronary artery disease 2007   CABG 2v Dorris Fetch)   History of chicken pox    Hyperlipidemia    Hypertension    Past Surgical History:  Procedure Laterality Date   COLONOSCOPY WITH PROPOFOL N/A 12/01/2017   Procedure: COLONOSCOPY WITH PROPOFOL;  Surgeon: Midge Minium, MD;  Location: [facility] ENDOSCOPY;  Service: Endoscopy;  Laterality: N/A;   CORONARY ANGIOPLASTY     CORONARY ARTERY BYPASS GRAFT  2007   2 vessel, [place]   PERCUTANEOUS CORONARY STENT INTERVENTION (PCI-S)  2001   mid L circ   TONSILLECTOMY     Family History  Problem Relation Age of Onset   Heart disease Mother        ICD and stents   Stroke Mother        possibly   CAD Father 38       MI   CAD Sister        triple bypass   CAD Brother 46  Diabetes Neg Hx    Cancer Neg Hx    Social History   Socioeconomic History   Marital status: Married    Spouse name: Not on file   Number of children: 1   Years of education: Not on file   Highest education level: Bachelor's degree (e.g., BA, AB, BS)  Occupational History   Not on file  Tobacco Use   Smoking status: Former    Packs/day: 1.00    Years: 3.00    Pack years: 3.00    Types: Cigarettes    Quit date: 08/26/1979    Years since quitting: 41.8   Smokeless tobacco: Never  Vaping Use   Vaping Use: Never used  Substance and Sexual Activity   Alcohol use: Yes    Alcohol/week: 2.0 - 3.0 standard drinks    Types: 2 - 3 Standard drinks or equivalent per week     Comment: any kind of alcohol   Drug use: No   Sexual activity: Not on file  Other Topics Concern   Not on file  Social History Narrative   Caffeine: 2-3 cups coffee/day   Lives with wife, mother in law, cats and dog.  grown daughter   Occupation: Airline pilotsales, Scientist, water qualityaccount manager   Edu: BA at [employer]   Activity: walking 2 mi 5d/wk   Diet: good water, fruits/vegetables daily   Social Determinants of Corporate investment bankerHealth   Financial Resource Strain: Not on file  Food Insecurity: Not on file  Transportation Needs: Not on file  Physical Activity: Not on file  Stress: Not on file  Social Connections: Not on file    Tobacco Counseling Counseling given: Not Answered   Clinical Intake:  Pre-visit preparation completed: Yes  Pain : No/denies pain     Nutritional Risks: None Diabetes: No  How often do you need to have someone help you when you read instructions, pamphlets, or other written materials from your doctor or pharmacy?: 1 - Never  Diabetic?no  Interpreter Needed?: No  Information entered by :: Leonard BuckerLorrie Monie Shere, LPN   Activities of Daily Living No flowsheet data found.  Patient Care Team: Malva LimesFisher, Leonard E, MD as PCP - General (Family Medicine) Leonard Hughes, Leonard Pizzaimothy J, MD as Consulting Physician (Cardiology) System, Provider Not In (Ophthalmology)  Indicate any recent Medical Services you may have received from other than [facility] providers in the past year (date may be approximate).     Assessment:   This is a routine wellness examination for Leonard NeedleMichael.  Hearing/Vision screen No results found.  Dietary issues and exercise activities discussed:     Goals Addressed   None    Depression Screen PHQ 2/9 Scores 06/21/2020 06/01/2020 10/02/2017 09/29/2016 06/28/2012  PHQ - 2 Score 0 0 0 0 2  PHQ- 9 Score - 0 0 - 4    Fall Risk Fall Risk  06/21/2020 06/01/2020 10/02/2017 09/29/2016  Falls in the past year? 0 0 No No  Number falls in past yr: 0 0 - -  Injury with Fall? 0 0 - -  Risk for fall due to : -  No Fall Risks - -  Follow up - Falls evaluation completed - -    FALL RISK PREVENTION PERTAINING TO THE HOME:  Any stairs in or around the home? Yes  If so, are there any without handrails? No  Home free of loose throw rugs in walkways, pet beds, electrical cords, etc? Yes  Adequate lighting in your home to reduce risk of falls? Yes   ASSISTIVE DEVICES  UTILIZED TO PREVENT FALLS:  Life alert? No  Use of a cane, walker or w/c? No  Grab bars in the bathroom? No  Shower chair or bench in shower? No  Elevated toilet seat or a handicapped toilet? No  Cognitive Function:Normal cognitive status assessed by direct observation by this Nurse Health Advisor. No abnormalities found.       6CIT Screen 06/01/2020  What Year? 0 points  What month? 0 points  What time? 0 points  Count back from 20 0 points  Months in reverse 0 points  Repeat phrase 0 points  Total Score 0    Immunizations Immunization History  Administered Date(s) Administered   Fluad Quad(high Dose 65+) 03/28/2019, 03/27/2020, 03/27/2020, 03/27/2021   Influenza Split 06/28/2012   Influenza, High Dose Seasonal PF 04/07/2018   Influenza,inj,Quad PF,6+ Mos 06/28/2013, 07/03/2014, 03/13/2016, 03/24/2017   Moderna Sars-Covid-2 Vaccination 07/08/2019, 08/05/2019, 04/16/2020   Pneumococcal Conjugate-13 07/03/2014   Pneumococcal Polysaccharide-23 06/28/2013   Td 05/26/2005   Tdap 09/24/2015   Zoster, Live 09/24/2015    TDAP status: Up to date  Flu Vaccine status: Up to date  Pneumococcal vaccine status: Up to date  Covid-19 vaccine status: Completed vaccines  Qualifies for Shingles Vaccine? Yes   Zostavax completed Yes   Shingrix Completed?: No.    Education has been provided regarding the importance of this vaccine. Patient has been advised to call insurance company to determine out of pocket expense if they have not yet received this vaccine. Advised may also receive vaccine at local pharmacy or Health Dept.  Verbalized acceptance and understanding.  Screening Tests Health Maintenance  Topic Date Due   Zoster Vaccines- Shingrix (1 of 2) Never done   COVID-19 Vaccine (4 - Booster for Moderna series) 06/11/2020   TETANUS/TDAP  09/23/2025   COLONOSCOPY (Pts 45-4517yrs Insurance coverage will need to be confirmed)  12/02/2027   Pneumonia Vaccine 3465+ Years old  Completed   INFLUENZA VACCINE  Completed   Hepatitis C Screening  Completed   HPV VACCINES  Aged Out    Health Maintenance  Health Maintenance Due  Topic Date Due   Zoster Vaccines- Shingrix (1 of 2) Never done   COVID-19 Vaccine (4 - Booster for Moderna series) 06/11/2020    Colorectal cancer screening: Type of screening: Colonoscopy. Completed 12/01/17. Repeat every 10 years  Lung Cancer Screening: (Low Dose CT Chest recommended if Age 33-80 years, 30 pack-year currently smoking OR have quit w/in 15years.) does not qualify.    Additional Screening:  Hepatitis C Screening: does qualify; Completed 09/19/15  Vision Screening: Recommended annual ophthalmology exams for early detection of glaucoma and other disorders of the eye. Is the patient up to date with their annual eye exam?  Yes  Who is the provider or what is the name of the office in which the patient attends annual eye exams? [facility] If pt is not established with a provider, would they like to be referred to a provider to establish care? No .   Dental Screening: Recommended annual dental exams for proper oral hygiene  Community Resource Referral / Chronic Care Management: CRR required this visit?  No   CCM required this visit?  No      Plan:     I have personally reviewed and noted the following in the patients chart:   Medical and social history Use of alcohol, tobacco or illicit drugs  Current medications and supplements including opioid prescriptions. Patient is not currently taking opioid prescriptions. Functional ability and  status Nutritional  status Physical activity Advanced directives List of other physicians Hospitalizations, surgeries, and ER visits in previous 12 months Vitals Screenings to include cognitive, depression, and falls Referrals and appointments  In addition, I have reviewed and discussed with patient certain preventive protocols, quality metrics, and best practice recommendations. A written personalized care plan for preventive services as well as general preventive health recommendations were provided to patient.     Leonard Hope, LPN   01/29/453   Nurse Notes: none

## 2021-06-27 NOTE — Patient Instructions (Signed)
Leonard Hughes , Thank you for taking time to come for your Medicare Wellness Visit. I appreciate your ongoing commitment to your health goals. Please review the following plan we discussed and let me know if I can assist you in the future.   Screening recommendations/referrals: Colonoscopy: 12/01/17 Recommended yearly ophthalmology/optometry visit for glaucoma screening and checkup Recommended yearly dental visit for hygiene and checkup  Vaccinations: Influenza vaccine: 03/27/21 Pneumococcal vaccine: 07/03/14 Tdap vaccine: 09/24/15 Shingles vaccine: Zostavax 09/24/15   Covid-19: 07/08/19, 08/05/19, 04/16/20  Advanced directives: no  Conditions/risks identified: none  Next appointment: Follow up in one year for your annual wellness visit. 06/30/22 @ 8:20am by phone  Preventive Care 65 Years and Older, Male Preventive care refers to lifestyle choices and visits with your health care provider that can promote health and wellness. What does preventive care include? A yearly physical exam. This is also called an annual well check. Dental exams once or twice a year. Routine eye exams. Ask your health care provider how often you should have your eyes checked. Personal lifestyle choices, including: Daily care of your teeth and gums. Regular physical activity. Eating a healthy diet. Avoiding tobacco and drug use. Limiting alcohol use. Practicing safe sex. Taking low doses of aspirin every day. Taking vitamin and mineral supplements as recommended by your health care provider. What happens during an annual well check? The services and screenings done by your health care provider during your annual well check will depend on your age, overall health, lifestyle risk factors, and family history of disease. Counseling  Your health care provider may ask you questions about your: Alcohol use. Tobacco use. Drug use. Emotional well-being. Home and relationship well-being. Sexual activity. Eating  habits. History of falls. Memory and ability to understand (cognition). Work and work Astronomer. Screening  You may have the following tests or measurements: Height, weight, and BMI. Blood pressure. Lipid and cholesterol levels. These may be checked every 5 years, or more frequently if you are over 16 years old. Skin check. Lung cancer screening. You may have this screening every year starting at age 39 if you have a 30-pack-year history of smoking and currently smoke or have quit within the past 15 years. Fecal occult blood test (FOBT) of the stool. You may have this test every year starting at age 74. Flexible sigmoidoscopy or colonoscopy. You may have a sigmoidoscopy every 5 years or a colonoscopy every 10 years starting at age 38. Prostate cancer screening. Recommendations will vary depending on your family history and other risks. Hepatitis C blood test. Hepatitis B blood test. Sexually transmitted disease (STD) testing. Diabetes screening. This is done by checking your blood sugar (glucose) after you have not eaten for a while (fasting). You may have this done every 1-3 years. Abdominal aortic aneurysm (AAA) screening. You may need this if you are a current or former smoker. Osteoporosis. You may be screened starting at age 20 if you are at high risk. Talk with your health care provider about your test results, treatment options, and if necessary, the need for more tests. Vaccines  Your health care provider may recommend certain vaccines, such as: Influenza vaccine. This is recommended every year. Tetanus, diphtheria, and acellular pertussis (Tdap, Td) vaccine. You may need a Td booster every 10 years. Zoster vaccine. You may need this after age 70. Pneumococcal 13-valent conjugate (PCV13) vaccine. One dose is recommended after age 13. Pneumococcal polysaccharide (PPSV23) vaccine. One dose is recommended after age 104. Talk to your health care  provider about which screenings and  vaccines you need and how often you need them. This information is not intended to replace advice given to you by your health care provider. Make sure you discuss any questions you have with your health care provider. Document Released: 06/08/2015 Document Revised: 01/30/2016 Document Reviewed: 03/13/2015 Elsevier Interactive Patient Education  2017 [employer] Prevention in the Home Falls can cause injuries. They can happen to people of all ages. There are many things you can do to make your home safe and to help prevent falls. What can I do on the outside of my home? Regularly fix the edges of walkways and driveways and fix any cracks. Remove anything that might make you trip as you walk through a door, such as a raised step or threshold. Trim any bushes or trees on the path to your home. Use bright outdoor lighting. Clear any walking paths of anything that might make someone trip, such as rocks or tools. Regularly check to see if handrails are loose or broken. Make sure that both sides of any steps have handrails. Any raised decks and porches should have guardrails on the edges. Have any leaves, snow, or ice cleared regularly. Use sand or salt on walking paths during winter. Clean up any spills in your garage right away. This includes oil or grease spills. What can I do in the bathroom? Use night lights. Install grab bars by the toilet and in the tub and shower. Do not use towel bars as grab bars. Use non-skid mats or decals in the tub or shower. If you need to sit down in the shower, use a plastic, non-slip stool. Keep the floor dry. Clean up any water that spills on the floor as soon as it happens. Remove soap buildup in the tub or shower regularly. Attach bath mats securely with double-sided non-slip rug tape. Do not have throw rugs and other things on the floor that can make you trip. What can I do in the bedroom? Use night lights. Make sure that you have a light by your  bed that is easy to reach. Do not use any sheets or blankets that are too big for your bed. They should not hang down onto the floor. Have a firm chair that has side arms. You can use this for support while you get dressed. Do not have throw rugs and other things on the floor that can make you trip. What can I do in the kitchen? Clean up any spills right away. Avoid walking on wet floors. Keep items that you use a lot in easy-to-reach places. If you need to reach something above you, use a strong step stool that has a grab bar. Keep electrical cords out of the way. Do not use floor polish or wax that makes floors slippery. If you must use wax, use non-skid floor wax. Do not have throw rugs and other things on the floor that can make you trip. What can I do with my stairs? Do not leave any items on the stairs. Make sure that there are handrails on both sides of the stairs and use them. Fix handrails that are broken or loose. Make sure that handrails are as long as the stairways. Check any carpeting to make sure that it is firmly attached to the stairs. Fix any carpet that is loose or worn. Avoid having throw rugs at the top or bottom of the stairs. If you do have throw rugs, attach them to the  floor with carpet tape. Make sure that you have a light switch at the top of the stairs and the bottom of the stairs. If you do not have them, ask someone to add them for you. What else can I do to help prevent falls? Wear shoes that: Do not have high heels. Have rubber bottoms. Are comfortable and fit you well. Are closed at the toe. Do not wear sandals. If you use a stepladder: Make sure that it is fully opened. Do not climb a closed stepladder. Make sure that both sides of the stepladder are locked into place. Ask someone to hold it for you, if possible. Clearly mark and make sure that you can see: Any grab bars or handrails. First and last steps. Where the edge of each step is. Use tools that  help you move around (mobility aids) if they are needed. These include: Canes. Walkers. Scooters. Crutches. Turn on the lights when you go into a dark area. Replace any light bulbs as soon as they burn out. Set up your furniture so you have a clear path. Avoid moving your furniture around. If any of your floors are uneven, fix them. If there are any pets around you, be aware of where they are. Review your medicines with your doctor. Some medicines can make you feel dizzy. This can increase your chance of falling. Ask your doctor what other things that you can do to help prevent falls. This information is not intended to replace advice given to you by your health care provider. Make sure you discuss any questions you have with your health care provider. Document Released: 03/08/2009 Document Revised: 10/18/2015 Document Reviewed: 06/16/2014 Elsevier Interactive Patient Education  2017 [employer].

## 2021-07-07 ENCOUNTER — Other Ambulatory Visit: Payer: Self-pay | Admitting: Cardiovascular Disease

## 2021-09-18 ENCOUNTER — Encounter: Payer: Medicare HMO | Admitting: Family Medicine

## 2021-10-19 ENCOUNTER — Other Ambulatory Visit: Payer: Self-pay | Admitting: Family Medicine

## 2021-10-19 DIAGNOSIS — K219 Gastro-esophageal reflux disease without esophagitis: Secondary | ICD-10-CM

## 2021-12-23 DIAGNOSIS — I10 Essential (primary) hypertension: Secondary | ICD-10-CM | POA: Diagnosis not present

## 2021-12-23 DIAGNOSIS — E7800 Pure hypercholesterolemia, unspecified: Secondary | ICD-10-CM | POA: Diagnosis not present

## 2021-12-23 DIAGNOSIS — Z955 Presence of coronary angioplasty implant and graft: Secondary | ICD-10-CM | POA: Insufficient documentation

## 2021-12-23 DIAGNOSIS — I25810 Atherosclerosis of coronary artery bypass graft(s) without angina pectoris: Secondary | ICD-10-CM | POA: Diagnosis not present

## 2022-01-10 DIAGNOSIS — I25810 Atherosclerosis of coronary artery bypass graft(s) without angina pectoris: Secondary | ICD-10-CM | POA: Diagnosis not present

## 2022-01-15 DIAGNOSIS — E782 Mixed hyperlipidemia: Secondary | ICD-10-CM | POA: Diagnosis not present

## 2022-01-15 DIAGNOSIS — E538 Deficiency of other specified B group vitamins: Secondary | ICD-10-CM | POA: Diagnosis not present

## 2022-01-15 DIAGNOSIS — Z125 Encounter for screening for malignant neoplasm of prostate: Secondary | ICD-10-CM | POA: Diagnosis not present

## 2022-01-15 DIAGNOSIS — I2510 Atherosclerotic heart disease of native coronary artery without angina pectoris: Secondary | ICD-10-CM | POA: Diagnosis not present

## 2022-01-15 DIAGNOSIS — Z1389 Encounter for screening for other disorder: Secondary | ICD-10-CM | POA: Diagnosis not present

## 2022-01-15 DIAGNOSIS — R739 Hyperglycemia, unspecified: Secondary | ICD-10-CM | POA: Diagnosis not present

## 2022-01-15 DIAGNOSIS — Z0000 Encounter for general adult medical examination without abnormal findings: Secondary | ICD-10-CM | POA: Diagnosis not present

## 2022-01-15 DIAGNOSIS — I350 Nonrheumatic aortic (valve) stenosis: Secondary | ICD-10-CM | POA: Insufficient documentation

## 2022-01-20 DIAGNOSIS — I25810 Atherosclerosis of coronary artery bypass graft(s) without angina pectoris: Secondary | ICD-10-CM | POA: Diagnosis not present

## 2022-01-20 DIAGNOSIS — E782 Mixed hyperlipidemia: Secondary | ICD-10-CM | POA: Diagnosis not present

## 2022-01-20 DIAGNOSIS — Z955 Presence of coronary angioplasty implant and graft: Secondary | ICD-10-CM | POA: Diagnosis not present

## 2022-01-20 DIAGNOSIS — I350 Nonrheumatic aortic (valve) stenosis: Secondary | ICD-10-CM | POA: Diagnosis not present

## 2022-01-20 DIAGNOSIS — I10 Essential (primary) hypertension: Secondary | ICD-10-CM | POA: Diagnosis not present

## 2022-02-12 ENCOUNTER — Other Ambulatory Visit: Payer: Self-pay | Admitting: Cardiovascular Disease

## 2022-03-19 ENCOUNTER — Other Ambulatory Visit: Payer: Self-pay | Admitting: Family Medicine

## 2022-03-19 DIAGNOSIS — K219 Gastro-esophageal reflux disease without esophagitis: Secondary | ICD-10-CM

## 2022-04-04 ENCOUNTER — Other Ambulatory Visit: Payer: Self-pay | Admitting: Cardiovascular Disease

## 2022-04-14 DIAGNOSIS — H524 Presbyopia: Secondary | ICD-10-CM | POA: Diagnosis not present

## 2022-04-14 DIAGNOSIS — Z0100 Encounter for examination of eyes and vision without abnormal findings: Secondary | ICD-10-CM | POA: Diagnosis not present

## 2022-04-27 ENCOUNTER — Other Ambulatory Visit: Payer: Self-pay | Admitting: Cardiovascular Disease

## 2022-07-31 DIAGNOSIS — I2510 Atherosclerotic heart disease of native coronary artery without angina pectoris: Secondary | ICD-10-CM | POA: Diagnosis not present

## 2022-07-31 DIAGNOSIS — J45909 Unspecified asthma, uncomplicated: Secondary | ICD-10-CM | POA: Diagnosis not present

## 2022-09-08 DIAGNOSIS — Z955 Presence of coronary angioplasty implant and graft: Secondary | ICD-10-CM | POA: Diagnosis not present

## 2022-09-08 DIAGNOSIS — I350 Nonrheumatic aortic (valve) stenosis: Secondary | ICD-10-CM | POA: Diagnosis not present

## 2022-09-08 DIAGNOSIS — E782 Mixed hyperlipidemia: Secondary | ICD-10-CM | POA: Diagnosis not present

## 2022-09-08 DIAGNOSIS — I25810 Atherosclerosis of coronary artery bypass graft(s) without angina pectoris: Secondary | ICD-10-CM | POA: Diagnosis not present

## 2022-09-08 DIAGNOSIS — I10 Essential (primary) hypertension: Secondary | ICD-10-CM | POA: Diagnosis not present

## 2022-09-17 DIAGNOSIS — I350 Nonrheumatic aortic (valve) stenosis: Secondary | ICD-10-CM | POA: Diagnosis not present

## 2022-09-17 DIAGNOSIS — I25810 Atherosclerosis of coronary artery bypass graft(s) without angina pectoris: Secondary | ICD-10-CM | POA: Diagnosis not present

## 2022-10-23 DIAGNOSIS — J45909 Unspecified asthma, uncomplicated: Secondary | ICD-10-CM | POA: Diagnosis not present

## 2022-10-23 DIAGNOSIS — I2510 Atherosclerotic heart disease of native coronary artery without angina pectoris: Secondary | ICD-10-CM | POA: Diagnosis not present

## 2022-11-03 DIAGNOSIS — B3781 Candidal esophagitis: Secondary | ICD-10-CM | POA: Diagnosis not present

## 2022-11-03 DIAGNOSIS — I2510 Atherosclerotic heart disease of native coronary artery without angina pectoris: Secondary | ICD-10-CM | POA: Diagnosis not present

## 2023-01-16 DIAGNOSIS — E538 Deficiency of other specified B group vitamins: Secondary | ICD-10-CM | POA: Diagnosis not present

## 2023-01-16 DIAGNOSIS — Z125 Encounter for screening for malignant neoplasm of prostate: Secondary | ICD-10-CM | POA: Diagnosis not present

## 2023-01-16 DIAGNOSIS — R739 Hyperglycemia, unspecified: Secondary | ICD-10-CM | POA: Diagnosis not present

## 2023-01-16 DIAGNOSIS — E782 Mixed hyperlipidemia: Secondary | ICD-10-CM | POA: Diagnosis not present

## 2023-01-28 DIAGNOSIS — Z1331 Encounter for screening for depression: Secondary | ICD-10-CM | POA: Diagnosis not present

## 2023-01-28 DIAGNOSIS — Z0000 Encounter for general adult medical examination without abnormal findings: Secondary | ICD-10-CM | POA: Diagnosis not present

## 2023-01-28 DIAGNOSIS — E785 Hyperlipidemia, unspecified: Secondary | ICD-10-CM | POA: Diagnosis not present

## 2023-01-28 DIAGNOSIS — I350 Nonrheumatic aortic (valve) stenosis: Secondary | ICD-10-CM | POA: Diagnosis not present

## 2023-01-28 DIAGNOSIS — Z125 Encounter for screening for malignant neoplasm of prostate: Secondary | ICD-10-CM | POA: Diagnosis not present

## 2023-01-28 DIAGNOSIS — I6529 Occlusion and stenosis of unspecified carotid artery: Secondary | ICD-10-CM | POA: Diagnosis not present

## 2023-02-18 DIAGNOSIS — I6523 Occlusion and stenosis of bilateral carotid arteries: Secondary | ICD-10-CM | POA: Diagnosis not present

## 2023-03-09 DIAGNOSIS — B9689 Other specified bacterial agents as the cause of diseases classified elsewhere: Secondary | ICD-10-CM | POA: Diagnosis not present

## 2023-03-09 DIAGNOSIS — J209 Acute bronchitis, unspecified: Secondary | ICD-10-CM | POA: Diagnosis not present

## 2023-03-09 DIAGNOSIS — Z03818 Encounter for observation for suspected exposure to other biological agents ruled out: Secondary | ICD-10-CM | POA: Diagnosis not present

## 2023-03-09 DIAGNOSIS — J019 Acute sinusitis, unspecified: Secondary | ICD-10-CM | POA: Diagnosis not present

## 2023-04-16 DIAGNOSIS — Z955 Presence of coronary angioplasty implant and graft: Secondary | ICD-10-CM | POA: Diagnosis not present

## 2023-04-16 DIAGNOSIS — I25810 Atherosclerosis of coronary artery bypass graft(s) without angina pectoris: Secondary | ICD-10-CM | POA: Diagnosis not present

## 2023-04-16 DIAGNOSIS — Z23 Encounter for immunization: Secondary | ICD-10-CM | POA: Diagnosis not present

## 2023-04-16 DIAGNOSIS — E782 Mixed hyperlipidemia: Secondary | ICD-10-CM | POA: Diagnosis not present

## 2023-04-16 DIAGNOSIS — I350 Nonrheumatic aortic (valve) stenosis: Secondary | ICD-10-CM | POA: Diagnosis not present

## 2023-04-16 DIAGNOSIS — I10 Essential (primary) hypertension: Secondary | ICD-10-CM | POA: Diagnosis not present

## 2023-04-27 DIAGNOSIS — L309 Dermatitis, unspecified: Secondary | ICD-10-CM | POA: Diagnosis not present

## 2023-05-08 DIAGNOSIS — H524 Presbyopia: Secondary | ICD-10-CM | POA: Diagnosis not present

## 2023-06-26 DIAGNOSIS — U071 COVID-19: Secondary | ICD-10-CM | POA: Diagnosis not present

## 2023-06-26 DIAGNOSIS — Z03818 Encounter for observation for suspected exposure to other biological agents ruled out: Secondary | ICD-10-CM | POA: Diagnosis not present

## 2023-09-30 DIAGNOSIS — E782 Mixed hyperlipidemia: Secondary | ICD-10-CM | POA: Diagnosis not present

## 2023-09-30 DIAGNOSIS — I10 Essential (primary) hypertension: Secondary | ICD-10-CM | POA: Diagnosis not present

## 2023-09-30 DIAGNOSIS — I350 Nonrheumatic aortic (valve) stenosis: Secondary | ICD-10-CM | POA: Diagnosis not present

## 2023-09-30 DIAGNOSIS — I25810 Atherosclerosis of coronary artery bypass graft(s) without angina pectoris: Secondary | ICD-10-CM | POA: Diagnosis not present

## 2023-09-30 DIAGNOSIS — Z955 Presence of coronary angioplasty implant and graft: Secondary | ICD-10-CM | POA: Diagnosis not present

## 2023-10-13 DIAGNOSIS — M1711 Unilateral primary osteoarthritis, right knee: Secondary | ICD-10-CM | POA: Diagnosis not present

## 2023-10-13 DIAGNOSIS — R0981 Nasal congestion: Secondary | ICD-10-CM | POA: Diagnosis not present

## 2023-10-13 DIAGNOSIS — G609 Hereditary and idiopathic neuropathy, unspecified: Secondary | ICD-10-CM | POA: Diagnosis not present

## 2023-10-26 DIAGNOSIS — I340 Nonrheumatic mitral (valve) insufficiency: Secondary | ICD-10-CM | POA: Diagnosis not present

## 2023-11-06 DIAGNOSIS — M1711 Unilateral primary osteoarthritis, right knee: Secondary | ICD-10-CM | POA: Diagnosis not present

## 2023-11-08 DIAGNOSIS — M1711 Unilateral primary osteoarthritis, right knee: Principal | ICD-10-CM | POA: Insufficient documentation

## 2023-12-20 NOTE — Discharge Instructions (Signed)
 Instructions after Total Knee Replacement   Leonard Hughes P. Angie Fava., M.D.    Dept. of Orthopaedics & Sports Medicine [facility] [address] [place], [place]  [address]  Phone: [phone]   Fax: [phone]       [email or web address]       DIET: Drink plenty of non-alcoholic fluids. Resume your normal diet. Include foods high in fiber.  ACTIVITY:  You may use crutches or a walker with weight-bearing as tolerated, unless instructed otherwise. You may be weaned off of the walker or crutches by your Physical Therapist.  Do NOT place pillows under the knee. Anything placed under the knee could limit your ability to straighten the knee.   Use the Bone Foam 3 times a day for 30 minutes each session to help straighten the knee. Continue doing gentle exercises. Exercising will reduce the pain and swelling, increase motion, and prevent muscle weakness.   Please continue to use the TED compression stockings for 6 weeks. You may remove the stockings at night, but should reapply them in the morning. Do not drive or operate any equipment until instructed.  WOUND CARE:  The initial dressing (Aquacel) can remain in place for 7 days (see separate instructions). Continue to use the PolarCare or ice packs periodically to reduce pain and swelling. You may bathe or shower after the staples are removed at the first office visit following surgery.  MEDICATIONS: You may resume your regular medications. Please take the pain medication as prescribed on the medication. Do not take pain medication on an empty stomach. Unless instructed otherwise, you should take an enteric-coated aspirin 81 mg. TWICE a day. (This along with elevation will help reduce the possibility of blood clots/phlebitis in your operated leg.) Use a stool softener (such as Senokot-S or Colace) daily and a laxative (such as Miralax or Dulcolax) as needed to prevent constipation.  Do not drive or drink alcoholic beverages when  taking pain medications.  CALL THE OFFICE FOR: Temperature above 101 degrees Excessive bleeding or drainage on the dressing. Excessive swelling, coldness, or paleness of the toes. Persistent nausea and vomiting.  FOLLOW-UP:  You should have an appointment to return to the office in 10-14 days after surgery. Arrangements have been made for continuation of Physical Therapy (either home therapy or outpatient therapy).     [facility] Department Directory         [email or web address]       [email or web address]          Cardiology  Appointments: [place] - [phone]  Endocrinology  Appointments: [place] [phone] [place] - [phone]  Gastroenterology  Appointments: [employer] [phone] [place] - [phone]        General Surgery   Appointments: [facility]  Internal Medicine/Family Medicine  Appointments: [facility] [place] - [phone] [place] - [phone]  Metabolic and Weigh Loss Surgery  Appointments: [facility]        Neurology  Appointments: [place] [phone] [place] - [phone]  Neurosurgery  Appointments: [place]  Obstetrics & Gynecology  Appointments: [place] [phone] [place] - [phone]        Pediatrics  Appointments: [place] [phone] [place] - [phone]  Physiatry  Appointments: [place] [phone]  Physical Therapy  Appointments: [place] - [phone]        Podiatry  Appointments: [place] [phone] [place] - [phone]  Pulmonology  Appointments: [facility]  Rheumatology  Appointments: [place] [phone]  [place] Location: [facility]  [address] [place], [place]  [address]  [place] Location: [facility]. [address] [place], [place]  [address]  [place]  Location: [facility] [address] [place], [place]  [address]

## 2023-12-28 ENCOUNTER — Other Ambulatory Visit: Payer: Self-pay

## 2023-12-28 ENCOUNTER — Encounter
Admission: RE | Admit: 2023-12-28 | Discharge: 2023-12-28 | Disposition: A | Discharge status: Home / Self Care | Source: Ambulatory Visit | Attending: Orthopedic Surgery | Admitting: Orthopedic Surgery

## 2023-12-28 VITALS — BP 159/62 | HR 66 | Resp 16 | Ht 71.0 in | Wt 230.8 lb

## 2023-12-28 DIAGNOSIS — R7303 Prediabetes: Secondary | ICD-10-CM | POA: Diagnosis not present

## 2023-12-28 DIAGNOSIS — Z01810 Encounter for preprocedural cardiovascular examination: Secondary | ICD-10-CM | POA: Diagnosis not present

## 2023-12-28 DIAGNOSIS — Z01818 Encounter for other preprocedural examination: Secondary | ICD-10-CM | POA: Insufficient documentation

## 2023-12-28 DIAGNOSIS — Z01812 Encounter for preprocedural laboratory examination: Secondary | ICD-10-CM

## 2023-12-28 DIAGNOSIS — R9431 Abnormal electrocardiogram [ECG] [EKG]: Secondary | ICD-10-CM | POA: Insufficient documentation

## 2023-12-28 DIAGNOSIS — M1711 Unilateral primary osteoarthritis, right knee: Secondary | ICD-10-CM | POA: Insufficient documentation

## 2023-12-28 HISTORY — DX: Presence of aortocoronary bypass graft: Z95.1

## 2023-12-28 HISTORY — DX: Nonrheumatic aortic (valve) stenosis: I35.0

## 2023-12-28 HISTORY — DX: Prediabetes: R73.03

## 2023-12-28 HISTORY — DX: Presence of coronary angioplasty implant and graft: Z95.5

## 2023-12-28 HISTORY — DX: Unilateral primary osteoarthritis, right knee: M17.11

## 2023-12-28 HISTORY — DX: Gastro-esophageal reflux disease without esophagitis: K21.9

## 2023-12-28 HISTORY — DX: Cardiac murmur, unspecified: R01.1

## 2023-12-28 LAB — C-REACTIVE PROTEIN: CRP: 0.8 mg/dL (ref <1.0)

## 2023-12-28 LAB — COMPREHENSIVE METABOLIC PANEL WITH GFR
ALT: 37 U/L (ref 0–44)
AST: 37 U/L (ref 15–41)
Albumin: 3.9 g/dL (ref 3.5–5.0)
Alkaline Phosphatase: 41 U/L (ref 38–126)
Anion gap: 13 (ref 5–15)
BUN: 15 mg/dL (ref 8–23)
CO2: 29 mmol/L (ref 22–32)
Calcium: 9.2 mg/dL (ref 8.9–10.3)
Chloride: 105 mmol/L (ref 98–111)
Creatinine, Ser: 1.07 mg/dL (ref 0.61–1.24)
GFR, Estimated: >60 mL/min (ref >60)
Glucose, Bld: 110 mg/dL — ABNORMAL HIGH (ref 70–99)
Potassium: 4.5 mmol/L (ref 3.5–5.1)
Sodium: 147 mmol/L — ABNORMAL HIGH (ref 135–145)
Total Bilirubin: 1.1 mg/dL (ref 0.0–1.2)
Total Protein: 7.1 g/dL (ref 6.5–8.1)

## 2023-12-28 LAB — HEMOGLOBIN A1C
Hgb A1c MFr Bld: 5.8 % — ABNORMAL HIGH (ref 4.8–5.6)
Mean Plasma Glucose: 120 mg/dL

## 2023-12-28 LAB — URINALYSIS, ROUTINE W REFLEX MICROSCOPIC
Bilirubin Urine: NEGATIVE
Glucose, UA: NEGATIVE mg/dL
Hgb urine dipstick: NEGATIVE
Ketones, ur: NEGATIVE mg/dL
Leukocytes,Ua: NEGATIVE
Nitrite: NEGATIVE
Protein, ur: NEGATIVE mg/dL
Specific Gravity, Urine: 1.008 (ref 1.005–1.030)
pH: 6 (ref 5.0–8.0)

## 2023-12-28 LAB — CBC
HCT: 40.2 % (ref 39.0–52.0)
Hemoglobin: 13.9 g/dL (ref 13.0–17.0)
MCH: 31.4 pg (ref 26.0–34.0)
MCHC: 34.6 g/dL (ref 30.0–36.0)
MCV: 91 fL (ref 80.0–100.0)
Platelets: 179 K/uL (ref 150–400)
RBC: 4.42 MIL/uL (ref 4.22–5.81)
RDW: 11.6 % (ref 11.5–15.5)
WBC: 6.1 K/uL (ref 4.0–10.5)
nRBC: 0 % (ref 0.0–0.2)

## 2023-12-28 LAB — SEDIMENTATION RATE: Sed Rate: 10 mm/h (ref 0–20)

## 2023-12-28 LAB — SURGICAL PCR SCREEN
MRSA, PCR: NEGATIVE
Staphylococcus aureus: NEGATIVE

## 2023-12-28 NOTE — Patient Instructions (Addendum)
 Your procedure is scheduled on:01/06/24 - Wednesday Report to the Registration Desk on the 1st floor of the [facility]. To find out your arrival time, please call [phone] between 1PM - 3PM on: 01/05/24 - Tuesday If your arrival time is 6:00 am, do not arrive before that time as the [facility] entrance doors do not open until 6:00 am.  REMEMBER: Instructions that are not followed completely may result in serious medical risk, up to and including death; or upon the discretion of your surgeon and anesthesiologist your surgery may need to be rescheduled.  Do not eat food after midnight the night before surgery.  No gum chewing or hard candies.  You may however, drink CLEAR liquids up to 2 hours before you are scheduled to arrive for your surgery. Do not drink anything within 2 hours of your scheduled arrival time.  Clear liquids include: - water  - apple juice without pulp - gatorade (not RED colors) - black coffee or tea (Do NOT add milk or creamers to the coffee or tea) Do NOT drink anything that is not on this list.  In addition, your doctor has ordered for you to drink the provided:  Ensure Pre-Surgery Clear Carbohydrate Drink  Drinking this carbohydrate drink up to two hours before surgery helps to reduce insulin resistance and improve patient outcomes. Please complete drinking 2 hours before scheduled arrival time.  One week prior to surgery: Stop Anti-inflammatories (NSAIDS) such as Advil, Aleve, Ibuprofen, Motrin, Naproxen, Naprosyn and Aspirin based products such as Excedrin, Goody's Powder, BC Powder. You may take Tylenol  if needed for pain up until the day of surgery.  Stop ANY OVER THE COUNTER supplements until after surgery : Omega-3 Fatty Acids  , Multivitamin.  HOLD Plavix  beginning 12/30/23, resume with doctor instructions.  ON THE DAY OF SURGERY ONLY TAKE THESE MEDICATIONS WITH SIPS OF WATER:  metoprolol  succinate (TOPROL -XL)    No Alcohol for 24 hours  before or after surgery.  No Smoking including e-cigarettes for 24 hours before surgery.  No chewable tobacco products for at least 6 hours before surgery.  No nicotine patches on the day of surgery.  Do not use any recreational drugs for at least a week (preferably 2 weeks) before your surgery.  Please be advised that the combination of cocaine and anesthesia may have negative outcomes, up to and including death. If you test positive for cocaine, your surgery will be cancelled.  On the morning of surgery brush your teeth with toothpaste and water, you may rinse your mouth with mouthwash if you wish. Do not swallow any toothpaste or mouthwash.  Use CHG Soap or wipes as directed on instruction sheet.  Do not wear jewelry, make-up, hairpins, clips or nail polish.  For welded (permanent) jewelry: bracelets, anklets, waist bands, etc.  Please have this removed prior to surgery.  If it is not removed, there is a chance that hospital personnel will need to cut it off on the day of surgery.  Do not wear lotions, powders, or perfumes.   Do not shave body hair from the neck down 48 hours before surgery.  Contact lenses, hearing aids and dentures may not be worn into surgery.  Do not bring valuables to the hospital. [facility] is not responsible for any missing/lost belongings or valuables.   Notify your doctor if there is any change in your medical condition (cold, fever, infection).  Wear comfortable clothing (specific to your surgery type) to the hospital.  After surgery, you can  help prevent lung complications by doing breathing exercises.  Take deep breaths and cough every 1-2 hours. Your doctor may order a device called an Incentive Spirometer to help you take deep breaths.  When coughing or sneezing, hold a pillow firmly against your incision with both hands. This is called "splinting." Doing this helps protect your incision. It also decreases belly discomfort.  If you are being  admitted to the hospital overnight, leave your suitcase in the car. After surgery it may be brought to your room.  In case of increased patient census, it may be necessary for you, the patient, to continue your postoperative care in the Same Day Surgery department.  If you are being discharged the day of surgery, you will not be allowed to drive home. You will need a responsible individual to drive you home and stay with you for 24 hours after surgery.   If you are taking public transportation, you will need to have a responsible individual with you.  Please call the Pre-admissions Testing Dept. at [phone] if you have any questions about these instructions.  Surgery Visitation Policy:  Patients having surgery or a procedure may have two visitors.  Children under the age of 45 must have an adult with them who is not the patient.  Inpatient Visitation:    Visiting hours are 7 a.m. to 8 p.m. Up to four visitors are allowed at one time in a patient room. The visitors may rotate out with other people during the day.  One visitor age 7 or older may stay with the patient overnight and must be in the room by 8 p.m.   Merchandiser, retail to address health-related social needs:  [email or web address]    Pre-operative 5 CHG Bath Instructions   You can play a key role in reducing the risk of infection after surgery. Your skin needs to be as free of germs as possible. You can reduce the number of germs on your skin by washing with CHG (chlorhexidine gluconate) soap before surgery. CHG is an antiseptic soap that kills germs and continues to kill germs even after washing.   DO NOT use if you have an allergy to chlorhexidine/CHG or antibacterial soaps. If your skin becomes reddened or irritated, stop using the CHG and notify one of our RNs at [phone].   Please shower with the CHG soap starting 4 days before surgery using the following schedule: 08/09 -  08/13.    Please keep in mind the following:  DO NOT shave, including legs and underarms, starting the day of your first shower.   You may shave your face at any point before/day of surgery.  Place clean sheets on your bed the day you start using CHG soap. Use a clean washcloth (not used since being washed) for each shower. DO NOT sleep with pets once you start using the CHG.   CHG Shower Instructions:  If you choose to wash your hair and private area, wash first with your normal shampoo/soap.  After you use shampoo/soap, rinse your hair and body thoroughly to remove shampoo/soap residue.  Turn the water OFF and apply about 3 tablespoons (45 ml) of CHG soap to a CLEAN washcloth.  Apply CHG soap ONLY FROM YOUR NECK DOWN TO YOUR TOES (washing for 3-5 minutes)  DO NOT use CHG soap on face, private areas, open wounds, or sores.  Pay special attention to the area where your surgery is being performed.  If you are having back surgery, having someone  wash your back for you may be helpful. Wait 2 minutes after CHG soap is applied, then you may rinse off the CHG soap.  Pat dry with a clean towel  Put on clean clothes/pajamas   If you choose to wear lotion, please use ONLY the CHG-compatible lotions on the back of this paper.     Additional instructions for the day of surgery: DO NOT APPLY any lotions, deodorants, cologne, or perfumes.   Put on clean/comfortable clothes.  Brush your teeth.  Ask your nurse before applying any prescription medications to the skin.      CHG Compatible Lotions   Aveeno Moisturizing lotion  Cetaphil Moisturizing Cream  Cetaphil Moisturizing Lotion  Clairol Herbal Essence Moisturizing Lotion, Dry Skin  Clairol Herbal Essence Moisturizing Lotion, Extra Dry Skin  Clairol Herbal Essence Moisturizing Lotion, Normal Skin  Curel Age Defying Therapeutic Moisturizing Lotion with Alpha Hydroxy  Curel Extreme Care Body Lotion  Curel Soothing Hands Moisturizing Hand  Lotion  Curel Therapeutic Moisturizing Cream, Fragrance-Free  Curel Therapeutic Moisturizing Lotion, Fragrance-Free  Curel Therapeutic Moisturizing Lotion, Original Formula  Eucerin Daily Replenishing Lotion  Eucerin Dry Skin Therapy Plus Alpha Hydroxy Crme  Eucerin Dry Skin Therapy Plus Alpha Hydroxy Lotion  Eucerin Original Crme  Eucerin Original Lotion  Eucerin Plus Crme Eucerin Plus Lotion  Eucerin TriLipid Replenishing Lotion  Keri Anti-Bacterial Hand Lotion  Keri Deep Conditioning Original Lotion Dry Skin Formula Softly Scented  Keri Deep Conditioning Original Lotion, Fragrance Free Sensitive Skin Formula  Keri Lotion Fast Absorbing Fragrance Free Sensitive Skin Formula  Keri Lotion Fast Absorbing Softly Scented Dry Skin Formula  Keri Original Lotion  Keri Skin Renewal Lotion Keri Silky Smooth Lotion  Keri Silky Smooth Sensitive Skin Lotion  Nivea Body Creamy Conditioning Oil  Nivea Body Extra Enriched Lotion  Nivea Body Original Lotion  Nivea Body Sheer Moisturizing Lotion Nivea Crme  Nivea Skin Firming Lotion  NutraDerm 30 Skin Lotion  NutraDerm Skin Lotion  NutraDerm Therapeutic Skin Cream  NutraDerm Therapeutic Skin Lotion  ProShield Protective Hand Cream  Provon moisturizing lotion  How to Use an Incentive Spirometer  An incentive spirometer is a tool that measures how well you are filling your lungs with each breath. Learning to take long, deep breaths using this tool can help you keep your lungs clear and active. This may help to reverse or lessen your chance of developing breathing (pulmonary) problems, especially infection. You may be asked to use a spirometer: After a surgery. If you have a lung problem or a history of smoking. After a long period of time when you have been unable to move or be active. If the spirometer includes an indicator to show the highest number that you have reached, your health care provider or respiratory therapist will help you set a  goal. Keep a log of your progress as told by your health care provider. What are the risks? Breathing too quickly may cause dizziness or cause you to pass out. Take your time so you do not get dizzy or light-headed. If you are in pain, you may need to take pain medicine before doing incentive spirometry. It is harder to take a deep breath if you are having pain. How to use your incentive spirometer  Sit up on the edge of your bed or on a chair. Hold the incentive spirometer so that it is in an upright position. Before you use the spirometer, breathe out normally. Place the mouthpiece in your mouth. Make sure your lips  are closed tightly around it. Breathe in slowly and as deeply as you can through your mouth, causing the piston or the ball to rise toward the top of the chamber. Hold your breath for 3-5 seconds, or for as long as possible. If the spirometer includes a coach indicator, use this to guide you in breathing. Slow down your breathing if the indicator goes above the marked areas. Remove the mouthpiece from your mouth and breathe out normally. The piston or ball will return to the bottom of the chamber. Rest for a few seconds, then repeat the steps 10 or more times. Take your time and take a few normal breaths between deep breaths so that you do not get dizzy or light-headed. Do this every 1-2 hours when you are awake. If the spirometer includes a goal marker to show the highest number you have reached (best effort), use this as a goal to work toward during each repetition. After each set of 10 deep breaths, cough a few times. This will help to make sure that your lungs are clear. If you have an incision on your chest or abdomen from surgery, place a pillow or a rolled-up towel firmly against the incision when you cough. This can help to reduce pain while taking deep breaths and coughing. General tips When you are able to get out of bed: Walk around often. Continue to take deep breaths  and cough in order to clear your lungs. Keep using the incentive spirometer until your health care provider says it is okay to stop using it. If you have been in the hospital, you may be told to keep using the spirometer at home. Contact a health care provider if: You are having difficulty using the spirometer. You have trouble using the spirometer as often as instructed. Your pain medicine is not giving enough relief for you to use the spirometer as told. You have a fever. Get help right away if: You develop shortness of breath. You develop a cough with bloody mucus from the lungs. You have fluid or blood coming from an incision site after you cough. Summary An incentive spirometer is a tool that can help you learn to take long, deep breaths to keep your lungs clear and active. You may be asked to use a spirometer after a surgery, if you have a lung problem or a history of smoking, or if you have been inactive for a long period of time. Use your incentive spirometer as instructed every 1-2 hours while you are awake. If you have an incision on your chest or abdomen, place a pillow or a rolled-up towel firmly against your incision when you cough. This will help to reduce pain. Get help right away if you have shortness of breath, you cough up bloody mucus, or blood comes from your incision when you cough. This information is not intended to replace advice given to you by your health care provider. Make sure you discuss any questions you have with your health care provider. Document Revised: 08/01/2019 Document Reviewed: 08/01/2019 Elsevier Patient Education  2023 Elsevier Inc.  POLAR CARE INFORMATION  MassAdvertisement.it  How to use [facility]?  YouTube   ShippingScam.co.uk  OPERATING INSTRUCTIONS  Start the product With dry hands, connect the transformer to the electrical connection located on the top of the cooler. Next, plug the  transformer into an appropriate electrical outlet. The unit will automatically start running at this point.  To stop the pump, disconnect  electrical power.  Unplug to stop the product when not in use. Unplugging the Polar Care unit turns it off. Always unplug immediately after use. Never leave it plugged in while unattended. Remove pad.    FIRST ADD WATER TO FILL LINE, THEN ICE---Replace ice when existing ice is almost melted  1 Discuss Treatment with your Licensed Health Care Practitioner and Use Only as Prescribed 2 Apply Insulation Barrier & Cold Therapy Pad 3 Check for Moisture 4 Inspect Skin Regularly  Tips and Trouble Shooting Usage Tips 1. Use cubed or chunked ice for optimal performance. 2. It is recommended to drain the Pad between uses. To drain the pad, hold the Pad upright with the hose pointed toward the ground. Depress the black plunger and allow water to drain out. 3. You may disconnect the Pad from the unit without removing the pad from the affected area by depressing the silver tabs on the hose coupling and gently pulling the hoses apart. The Pad and unit will seal itself and will not leak. Note: Some dripping during release is normal. 4. DO NOT RUN PUMP WITHOUT WATER! The pump in this unit is designed to run with water. Running the unit without water will cause permanent damage to the pump. 5. Unplug unit before removing lid.  TROUBLESHOOTING GUIDE Pump not running, Water not flowing to the pad, Pad is not getting cold 1. Make sure the transformer is plugged into the wall outlet. 2. Confirm that the ice and water are filled to the indicated levels. 3. Make sure there are no kinks in the pad. 4. Gently pull on the blue tube to make sure the tube/pad junction is straight. 5. Remove the pad from the treatment site and ll it while the pad is lying at; then reapply. 6. Confirm that the pad couplings are securely attached to the unit. Listen for the double clicks (Figure 1) to  confirm the pad couplings are securely attached.  Leaks    Note: Some condensation on the lines, controller, and pads is unavoidable, especially in warmer climates. 1. If using a [employer] Polar Care Cold Therapy unit with a detachable Cold Therapy Pad, and a leak exists (other than condensation on the lines) disconnect the pad couplings. Make sure the silver tabs on the couplings are depressed before reconnecting the pad to the pump hose; then confirm both sides of the coupling are properly clicked in. 2. If the coupling continues to leak or a leak is detected in the pad itself, stop using it and call [employer] Customer Care at [phone].  Cleaning After use, empty and dry the unit with a soft cloth. Warm water and mild detergent may be used occasionally to clean the pump and tubes.  WARNING: The Polar Care Cube can be cold enough to cause serious injury, including full skin necrosis. Follow these Operating Instructions, and carefully read the Product Insert (see pouch on side of unit) and the Cold Therapy Pad Fitting Instructions (provided with each Cold Therapy Pad) prior to use.     Class available on 08/07.

## 2023-12-30 DIAGNOSIS — M1711 Unilateral primary osteoarthritis, right knee: Secondary | ICD-10-CM | POA: Diagnosis not present

## 2024-01-05 ENCOUNTER — Encounter: Payer: Self-pay | Admitting: Orthopedic Surgery

## 2024-01-05 MED ORDER — DEXAMETHASONE SODIUM PHOSPHATE 10 MG/ML IJ SOLN
8.0000 mg | Freq: Once | INTRAMUSCULAR | Status: AC
  Administered 2024-01-06 (×2): 8 mg via INTRAVENOUS

## 2024-01-05 MED ORDER — CEFAZOLIN SODIUM-DEXTROSE 2-4 GM/100ML-% IV SOLN
2.0000 g | INTRAVENOUS | Status: AC
  Administered 2024-01-06 (×2): 2 g via INTRAVENOUS

## 2024-01-05 MED ORDER — CHLORHEXIDINE GLUCONATE 4 % EX SOLN: 60.0000 mL | Freq: Once | CUTANEOUS | Status: DC

## 2024-01-05 MED ORDER — ORAL CARE MOUTH RINSE: 15.0000 mL | Freq: Once | OROMUCOSAL | Status: AC

## 2024-01-05 MED ORDER — TRANEXAMIC ACID-NACL 1000-0.7 MG/100ML-% IV SOLN: 1000.0000 mg | INTRAVENOUS | Status: DC

## 2024-01-05 MED ORDER — CHLORHEXIDINE GLUCONATE 0.12 % MT SOLN
15.0000 mL | Freq: Once | OROMUCOSAL | Status: AC
  Administered 2024-01-06 (×2): 15 mL via OROMUCOSAL

## 2024-01-05 MED ORDER — GABAPENTIN 300 MG PO CAPS
300.0000 mg | ORAL_CAPSULE | Freq: Once | ORAL | Status: AC
  Administered 2024-01-06 (×2): 300 mg via ORAL

## 2024-01-05 MED ORDER — CELECOXIB 200 MG PO CAPS
400.0000 mg | ORAL_CAPSULE | Freq: Once | ORAL | Status: AC
  Administered 2024-01-06 (×2): 400 mg via ORAL

## 2024-01-05 NOTE — Progress Notes (Signed)
 Perioperative / Anesthesia Services  Pre-Admission Testing Clinical Review / Pre-Operative Anesthesia Consult  Date: 01/05/24  PATIENT DEMOGRAPHICS: Name: Leonard Hughes DOB: March 20, 1946 MRN:   [id number]  Note: Available PAT nursing documentation and vital signs have been reviewed. Clinical nursing staff has updated patient's PMH/PSHx, current medication list, and drug allergies/intolerances to ensure complete and comprehensive history available to assist care teams in MDM as it pertains to the aforementioned surgical procedure and anticipated anesthetic course. Extensive review of available clinical information personally performed. [place] [facility] and PSHx updated with any diagnoses/procedures that  may have been inadvertently omitted during his intake with the pre-admission testing department's nursing staff.  PLANNED SURGICAL PROCEDURE(S):   Case: [id number] Date/Time: 01/06/24 0700   Procedure: ARTHROPLASTY, KNEE, TOTAL, USING IMAGELESS COMPUTER-ASSISTED NAVIGATION (Right: Knee)   Anesthesia type: Choice   Diagnosis: Primary osteoarthritis of right knee [M17.11]   Pre-op diagnosis: Primary osteoarthritis of right knee   Location: [facility] OR ROOM 01 / [facility] ORS FOR ANESTHESIA GROUP   Surgeons: Mardee Lynwood SQUIBB, MD        CLINICAL DISCUSSION: Leonard Hughes is a 78 y.o. male who is submitted for pre-surgical anesthesia review and clearance prior to him undergoing the above procedure. Patient is a Former Smoker (3 pack years; quit 08/1979). Pertinent PMH includes: CAD s/p CABG), diastolic dysfunction, cardiac murmur, aortic stenosis, HTN, HLD, prediabetes, GERD (on daily PPI), OA, lumbosacral DDD.  Patient is followed by cardiology Andrea, MD). He was last seen in the cardiology clinic on 09/30/2023; notes reviewed. At the time of his clinic visit, patient doing well overall from a cardiovascular perspective. Patient denied any chest pain, shortness of breath, PND, orthopnea,  palpitations, significant peripheral edema, weakness, fatigue, vertiginous symptoms, or presyncope/syncope. Patient with a past medical history significant for cardiovascular diagnoses. Documented physical exam was grossly benign, providing no evidence of acute exacerbation and/or decompensation of the patient's known cardiovascular conditions.  Patient underwent agnostic LEFT heart catheterization on 02/19/2000.  Study revealed obstructive coronary artery disease; 70% ostial D1, 60% D2, and 95% mid LCx.  PCI was subsequently performed placing a 3.5 x 15 mm S670 BMS to the mid LCx lesion.  Procedure yielded excellent angiographic result and TIMI-3 flow.  Patient underwent stress testing in 2007 that demonstrated concerns for ischemia.  Patient underwent diagnostic LEFT heart catheterization that revealed multivessel CAD.  Given the degree complexity of his coronary artery disease, the decision was made to defer further intervention opting for CVTS consultation for revascularization.  Patient ultimately underwent two-vessel revascularization procedure on 07/30/2005 at [facility] with Dr. Garnette Millers, MD.  Leonard Hughes and LIMA-OM1 bypass grafts were placed.  Most recent myocardial perfusion imaging study was performed on 01/10/2022 revealing a normal left ventricular systolic function with an EF of 53%.  There were no regional wall motion abnormalities.  No artifact or left ventricular cavity size enlargement appreciated on review of imaging. SPECT images demonstrated no evidence of stress-induced myocardial ischemia or arrhythmia; no scintigraphic evidence of scar.  TID ratio = 1.06. Study determined to be normal and low risk.  Most recent TTE performed on 10/26/2023 revealed a normal left ventricular systolic function with an EF of 55 %. There was mild LVH.  There were no regional wall motion abnormalities.  There was mild left atrial enlargement.  Left ventricular diastolic Doppler parameters  consistent with pseudonormalization (G2DD). Right ventricular size and function normal with a TAPSE measuring 2.2 cm  (normal range >/= 1.6 cm).  RVSP = 27  mmHg.  There was trivial to mild paravalvular regurgitation.  Aortic valve moderately stenotic with a mean transvalvular pressure gradient of 33 mmHg; AVA (VTI) 0.6 cm; DI = 0.19.  All other transvalvular gradients were noted to be normal providing no evidence of hemodynamically significant valvular stenosis. Aorta normal in size with no evidence of ectasia or aneurysmal dilatation.  Given patient's history of PCI with bare-metal stent placement, he remains on daily DAPT therapy using ASA + compatible.  Patient reported be compliant with therapy with no evidence of reports of GI/GU related bleeding.  Blood pressure reasonably controlled at 130/78 mmHg on currently prescribed ARB (losartan ) and beta-blocker (metoprolol  succinate) therapies.  Patient is on atorvastatin  + ezetimibe  for his HLD diagnosis and ASCVD prevention.  Patient has a prediabetes diagnosis that he is managing with diet and lifestyle modifications alone. Patient does not have an OSAH diagnosis. Patient is able to complete all of his  ADL/IADLs without cardiovascular limitation.  Per the DASI, patient is able to achieve at least 4 METS of physical activity without experiencing any significant degree of angina/anginal equivalent symptoms. No changes were made to his medication regimen during his visit with cardiology.  Patient scheduled to follow-up with outpatient cardiology in 6 months or sooner if needed.  Leonard Hughes is scheduled for an elective ARTHROPLASTY, KNEE, TOTAL, USING IMAGELESS COMPUTER-ASSISTED NAVIGATION (Right: Knee) on 01/06/2024 with Dr. Lynwood Hue, MD.  Given patient's past medical history significant for cardiovascular diagnoses, presurgical cardiac clearance was sought by the PAT team. Per cardiology, this patient is optimized for surgery and may proceed with the  planned procedural course with a LOW risk of significant perioperative cardiovascular complications.  Again, this patient is on daily antithrombotic therapy.  He is instructed on recommendations for holding his clopidogrel  dose for 7 days prior to his procedure with plans to restart since postoperatively risk to be minimized by his primary attending surgeon.  Patient is aware that his left wrist medical should be on 12/29/2023.  Patient denies previous perioperative complications with anesthesia in the past. In review his EMR, it is noted that patient underwent a general anesthetic course here at [facility] ([facility]) in 11/2017 without documented complications.   MOST RECENT VITAL SIGNS:    12/28/2023    8:57 AM 03/27/2021    8:03 AM 06/01/2020   10:09 AM  Vitals with BMI  Height 5' 11 5' 11 5' 11  Weight 230 lbs 13 oz 211 lbs 8 oz 218 lbs  BMI 32.21 29.51 30.42  Systolic 159 160 831  Diastolic 62 70 82  Pulse 66 57 55   PROVIDERS/SPECIALISTS: NOTE: Primary physician provider listed below. Patient may have been seen by APP or partner within same practice.   PROVIDER ROLE / SPECIALTY LAST OV  Hooten, Lynwood SQUIBB, MD Orthopedics (Surgeon) 12/30/2023  Cleotilde Oneil FALCON, MD Primary Care Provider 10/13/2023  Ammon Blunt, MD Cardiology 09/30/2023   ALLERGIES: Allergies  Allergen Reactions   Crestor [Rosuvastatin] Other (See Comments)    myalgias    CURRENT HOME MEDICATIONS: No current facility-administered medications for this encounter.    aspirin  81 MG EC tablet   atorvastatin  (LIPITOR) 40 MG tablet   clopidogrel  (PLAVIX ) 75 MG tablet   ezetimibe  (ZETIA ) 10 MG tablet   losartan  (COZAAR ) 50 MG tablet   metoprolol  succinate (TOPROL -XL) 25 MG 24 hr tablet   Multiple Vitamin (MULTIVITAMIN) tablet   Omega-3 Fatty Acids  (FISH OIL PO)   omeprazole  (PRILOSEC) 20  MG capsule   Propylene Glycol (LUBRICANT EYE DROP) 0.6 % SOLN   HISTORY: Past  Medical History:  Diagnosis Date   Aortic stenosis    Coronary artery disease 2001   a.) 95% mLCx (3.0 x 15 mm S670 BMS); b.) 2v CABG (RIMA-LAD, LIMA-OM1) 07/30/2005   DDD (degenerative disc disease), lumbosacral    Diastolic dysfunction    GERD (gastroesophageal reflux disease)    Heart murmur    History of chicken pox    Hyperlipidemia    Hypertension    Long-term use of aspirin  therapy    Pre-diabetes    Primary osteoarthritis of right knee    S/P CABG x 2 07/30/2005   a.) 2v CABG (RIMA-LAD, LIMA-OM1) 07/30/2005   Past Surgical History:  Procedure Laterality Date   COLONOSCOPY WITH PROPOFOL  N/A 12/01/2017   Procedure: COLONOSCOPY WITH PROPOFOL ;  Surgeon: Jinny Carmine, MD;  Location: [facility] ENDOSCOPY;  Service: Endoscopy;  Laterality: N/A;   CORONARY ANGIOPLASTY WITH STENT PLACEMENT Left 02/19/2000   Procedure: CORONARY ANGIOPLASTY WITH STENT PLACEMENT; Location: [facility]; Surgeon: Marsa Dooms, MD   CORONARY ARTERY BYPASS GRAFT N/A 07/30/2005   Procedure: CORONARY ARTERY BYPASS GRAFT (RIMA-LAD, LIMA-OM1); Location: [facility]; Surgeon: Elspeth Millers, MD   toe repair Left    due to injury 1970   TONSILLECTOMY     Family History  Problem Relation Age of Onset   Heart disease Mother        ICD and stents   Stroke Mother        possibly   CAD Father 48       MI   CAD Sister        triple bypass   CAD Brother 25   Diabetes Neg Hx    Cancer Neg Hx    Social History   Tobacco Use   Smoking status: Former    Current packs/day: 0.00    Average packs/day: 1 pack/day for 3.0 years (3.0 ttl pk-yrs)    Types: Cigarettes    Start date: 08/25/1976    Quit date: 08/26/1979    Years since quitting: 44.3   Smokeless tobacco: Never  Substance Use Topics   Alcohol  use: Yes    Alcohol /week: 2.0 - 3.0 standard drinks of alcohol     Types: 2 - 3 Standard drinks or equivalent per week    Comment: any kind of alcohol  socially   LABS:  Hospital Outpatient Visit on 12/28/2023   Component Date Value Ref Range Status   MRSA, PCR 12/28/2023 NEGATIVE  NEGATIVE Final   Staphylococcus aureus 12/28/2023 NEGATIVE  NEGATIVE Final   Comment: (NOTE) The Xpert SA Assay (FDA approved for NASAL specimens in patients 50 years of age and older), is one component of a comprehensive surveillance program. It is not intended to diagnose infection nor to guide or monitor treatment. Performed at [facility], [address] [place], [place] [address]    CRP 12/28/2023 0.8  <1.0 mg/dL Final   Performed at [facility], [address], [place], [place] [address]   Sed Rate 12/28/2023 10  0 - 20 mm/hr Final   Performed at [facility], [address] [place], [place] [address]   Hgb A1c MFr Bld 12/28/2023 5.8 (H)  4.8 - 5.6 % Final   Comment: (NOTE)         Prediabetes: 5.7 - 6.4         Diabetes: >6.4         Glycemic control for  adults with diabetes: <7.0    Mean Plasma Glucose 12/28/2023 120  mg/dL Final   Comment: (NOTE) Performed At: [facility] [address] [place], [place] [id number] Jennette Shorter MD Ey:[id number]    WBC 12/28/2023 6.1  4.0 - 10.5 K/uL Final   RBC 12/28/2023 4.42  4.22 - 5.81 MIL/uL Final   Hemoglobin 12/28/2023 13.9  13.0 - 17.0 g/dL Final   HCT 91/95/7974 40.2  39.0 - 52.0 % Final   MCV 12/28/2023 91.0  80.0 - 100.0 fL Final   MCH 12/28/2023 31.4  26.0 - 34.0 pg Final   MCHC 12/28/2023 34.6  30.0 - 36.0 g/dL Final   RDW 91/95/7974 11.6  11.5 - 15.5 % Final   Platelets 12/28/2023 179  150 - 400 K/uL Final   nRBC 12/28/2023 0.0  0.0 - 0.2 % Final   Performed at [facility], [address] [place], [place] [address]   Sodium 12/28/2023 147 (H)  135 - 145 mmol/L Final   Potassium 12/28/2023 4.5  3.5 - 5.1 mmol/L Final   Chloride 12/28/2023 105  98 - 111 mmol/L Final   CO2 12/28/2023 29  22 - 32 mmol/L Final   Glucose, Bld 12/28/2023 110 (H)  70 - 99 mg/dL Final   Glucose reference range applies  only to samples taken after fasting for at least 8 hours.   BUN 12/28/2023 15  8 - 23 mg/dL Final   Creatinine, Ser 12/28/2023 1.07  0.61 - 1.24 mg/dL Final   Calcium  12/28/2023 9.2  8.9 - 10.3 mg/dL Final   Total Protein 91/95/7974 7.1  6.5 - 8.1 g/dL Final   Albumin 91/95/7974 3.9  3.5 - 5.0 g/dL Final   AST 91/95/7974 37  15 - 41 U/L Final   ALT 12/28/2023 37  0 - 44 U/L Final   Alkaline Phosphatase 12/28/2023 41  38 - 126 U/L Final   Total Bilirubin 12/28/2023 1.1  0.0 - 1.2 mg/dL Final   GFR, Estimated 12/28/2023 >60  >60 mL/min Final   Comment: (NOTE) Calculated using the CKD-EPI Creatinine Equation (2021)    Anion gap 12/28/2023 13  5 - 15 Final   Performed at [facility], [address] [place], [place] [address]   Color, Urine 12/28/2023 YELLOW (A)  YELLOW Final   APPearance 12/28/2023 CLEAR (A)  CLEAR Final   Specific Gravity, Urine 12/28/2023 1.008  1.005 - 1.030 Final   pH 12/28/2023 6.0  5.0 - 8.0 Final   Glucose, UA 12/28/2023 NEGATIVE  NEGATIVE mg/dL Final   Hgb urine dipstick 12/28/2023 NEGATIVE  NEGATIVE Final   Bilirubin Urine 12/28/2023 NEGATIVE  NEGATIVE Final   Ketones, ur 12/28/2023 NEGATIVE  NEGATIVE mg/dL Final   Protein, ur 91/95/7974 NEGATIVE  NEGATIVE mg/dL Final   Nitrite 91/95/7974 NEGATIVE  NEGATIVE Final   Leukocytes,Ua 12/28/2023 NEGATIVE  NEGATIVE Final   Performed at [facility], [address] [place], [place] [address]    ECG: Date: 12/28/2023  Time ECG obtained: 0942 AM Rate: 62 bpm Rhythm: normal sinus Axis (leads I and aVF): normal Intervals: PR 162 ms. QRS 96 ms. QTc 448 ms. ST segment and T wave changes: No evidence of acute T wave abnormalities or significant ST segment elevation or depression.  Evidence of a possible, age undetermined, prior infarct:  Yes; anterior Comparison: Similar to previous tracing obtained on 03/27/2021   IMAGING / PROCEDURES: DIAGNOSTIC RADIOGRAPHS OF RIGHT KNEE 3 VIEWS performed on  11/06/2023 Significant narrowing of the lateral  cartilage space with associated valgus alignment.   Osteophyte formation is noted.   Subchondral sclerosis is noted.   No evidence of fracture or dislocation.   TRANSTHORACIC ECHOCARDIOGRAM performed on 10/26/2023 Normal left ventricular systolic function with EF of 55% Mild LVH No regional wall motion abnormalities Left atrium moderately enlarged Left ventricular diastolic Doppler parameters consistent with pseudonormalization (G2DD). Right ventricular size and function normal Trivial MR and TR Mild AR and TR RVSP = 27 mmHg Moderate aortic valve stenosis with a mean pressure gradient of 33 mmHg; AVA (VTI) 0.6 cm; DI = 0.19  MYOCARDIAL PERFUSION IMAGING STUDY (LEXISCAN) performed on 01/10/2022 Negative ETT Normal left ventricular function  Normal wall motion  No evidence for scar or ischemia   IMPRESSION AND PLAN: KEARY WATERSON has been referred for pre-anesthesia review and clearance prior to him undergoing the planned anesthetic and procedural courses. Available labs, pertinent testing, and imaging results were personally reviewed by me in preparation for upcoming operative/procedural course. [facility] medical record has been updated following extensive record review and patient interview with PAT staff.   This patient has been appropriately cleared by cardiology with an overall LOW risk of patient experiencing significant perioperative cardiovascular complications. Based on clinical review performed today (01/05/24), barring any significant acute changes in the patient's overall condition, it is anticipated that he will be able to proceed with the planned surgical intervention. Any acute changes in clinical condition may necessitate his procedure being postponed and/or cancelled. Patient will meet with anesthesia team (MD and/or CRNA) on the day of his procedure for preoperative evaluation/assessment. Questions regarding anesthetic  course will be fielded at that time.   Pre-surgical instructions were reviewed with the patient during his PAT appointment, and questions were fielded to satisfaction by PAT clinical staff. He has been instructed on which medications that he will need to hold prior to surgery, as well as the ones that have been deemed safe/appropriate to take on the day of his procedure. As part of the general education provided by PAT, patient made aware both verbally and in writing, that he would need to abstain from the use of any illegal substances during his perioperative course. He was advised that failure to follow the provided instructions could necessitate case cancellation or result in serious perioperative complications up to and including death. Patient encouraged to contact PAT and/or his surgeon's office to discuss any questions or concerns that may arise prior to surgery; verbalized understanding.   Dorise Pereyra, MSN, APRN, FNP-C, CEN [facility]  Perioperative Services Nurse Practitioner Phone: [phone] Fax: [phone] 01/05/24 11:50 AM  NOTE: This note has been prepared using Dragon dictation software. Despite my best ability to proofread, there is always the potential that unintentional transcriptional errors may still occur from this process.

## 2024-01-06 ENCOUNTER — Ambulatory Visit: Payer: Self-pay | Admitting: Urgent Care

## 2024-01-06 ENCOUNTER — Other Ambulatory Visit: Payer: Self-pay

## 2024-01-06 ENCOUNTER — Encounter: Payer: Self-pay | Admitting: Orthopedic Surgery

## 2024-01-06 ENCOUNTER — Observation Stay
Admission: RE | Admit: 2024-01-06 | Discharge: 2024-01-07 | Disposition: A | Discharge status: Home / Self Care | Attending: Orthopedic Surgery | Admitting: Orthopedic Surgery

## 2024-01-06 ENCOUNTER — Encounter
Admission: RE | Disposition: A | Discharge status: Home / Self Care | Payer: Self-pay | Source: Home / Self Care | Attending: Orthopedic Surgery

## 2024-01-06 ENCOUNTER — Observation Stay

## 2024-01-06 DIAGNOSIS — Z96651 Presence of right artificial knee joint: Secondary | ICD-10-CM | POA: Diagnosis not present

## 2024-01-06 DIAGNOSIS — Z87891 Personal history of nicotine dependence: Secondary | ICD-10-CM | POA: Diagnosis not present

## 2024-01-06 DIAGNOSIS — I10 Essential (primary) hypertension: Secondary | ICD-10-CM | POA: Insufficient documentation

## 2024-01-06 DIAGNOSIS — Z7982 Long term (current) use of aspirin: Secondary | ICD-10-CM | POA: Insufficient documentation

## 2024-01-06 DIAGNOSIS — Z471 Aftercare following joint replacement surgery: Secondary | ICD-10-CM | POA: Diagnosis not present

## 2024-01-06 DIAGNOSIS — M1711 Unilateral primary osteoarthritis, right knee: Principal | ICD-10-CM | POA: Insufficient documentation

## 2024-01-06 DIAGNOSIS — R7303 Prediabetes: Secondary | ICD-10-CM

## 2024-01-06 DIAGNOSIS — I2510 Atherosclerotic heart disease of native coronary artery without angina pectoris: Secondary | ICD-10-CM | POA: Diagnosis not present

## 2024-01-06 HISTORY — DX: Nonrheumatic aortic (valve) stenosis: I35.0

## 2024-01-06 HISTORY — PX: KNEE ARTHROPLASTY: SHX992

## 2024-01-06 HISTORY — DX: Long term (current) use of aspirin: Z79.82

## 2024-01-06 HISTORY — DX: Other intervertebral disc degeneration, lumbosacral region without mention of lumbar back pain or lower extremity pain: M51.379

## 2024-01-06 HISTORY — DX: Other ill-defined heart diseases: I51.89

## 2024-01-06 SURGERY — ARTHROPLASTY, KNEE, TOTAL, USING IMAGELESS COMPUTER-ASSISTED NAVIGATION
Anesthesia: Spinal | Site: Knee | Laterality: Right

## 2024-01-06 MED ORDER — SODIUM CHLORIDE (PF) 0.9 % IJ SOLN
INTRAMUSCULAR | Status: AC
  Filled 2024-01-06: qty 40

## 2024-01-06 MED ORDER — ONDANSETRON HCL 4 MG/2ML IJ SOLN
INTRAMUSCULAR | Status: DC | PRN
  Administered 2024-01-06 (×2): 4 mg via INTRAVENOUS

## 2024-01-06 MED ORDER — FERROUS SULFATE 325 (65 FE) MG PO TABS
325.0000 mg | ORAL_TABLET | Freq: Two times a day (BID) | ORAL | Status: DC
  Administered 2024-01-06 – 2024-01-07 (×3): 325 mg via ORAL
  Filled 2024-01-06 (×2): qty 1

## 2024-01-06 MED ORDER — EZETIMIBE 10 MG PO TABS: 10.0000 mg | ORAL_TABLET | Freq: Every day | ORAL | Status: DC

## 2024-01-06 MED ORDER — CHLORHEXIDINE GLUCONATE 0.12 % MT SOLN
OROMUCOSAL | Status: AC
  Filled 2024-01-06: qty 15

## 2024-01-06 MED ORDER — SODIUM CHLORIDE 0.9 % IR SOLN
Status: DC | PRN
  Administered 2024-01-06 (×2): 3000 mL

## 2024-01-06 MED ORDER — PROPOFOL 10 MG/ML IV BOLUS
INTRAVENOUS | Status: AC
  Filled 2024-01-06: qty 20

## 2024-01-06 MED ORDER — FENTANYL CITRATE (PF) 100 MCG/2ML IJ SOLN: 25.0000 ug | INTRAMUSCULAR | Status: DC | PRN

## 2024-01-06 MED ORDER — BUPIVACAINE HCL (PF) 0.5 % IJ SOLN
INTRAMUSCULAR | Status: DC | PRN
  Administered 2024-01-06 (×2): 3 mL via INTRATHECAL

## 2024-01-06 MED ORDER — SODIUM CHLORIDE 0.9 % IV SOLN: INTRAVENOUS | Status: DC

## 2024-01-06 MED ORDER — OXYCODONE HCL 5 MG PO TABS
ORAL_TABLET | ORAL | Status: AC
  Filled 2024-01-06: qty 1

## 2024-01-06 MED ORDER — BUPIVACAINE LIPOSOME 1.3 % IJ SUSP
INTRAMUSCULAR | Status: AC
  Filled 2024-01-06: qty 20

## 2024-01-06 MED ORDER — TRANEXAMIC ACID-NACL 1000-0.7 MG/100ML-% IV SOLN
INTRAVENOUS | Status: AC
  Filled 2024-01-06: qty 100

## 2024-01-06 MED ORDER — HYDROMORPHONE HCL 1 MG/ML IJ SOLN: 0.5000 mg | INTRAMUSCULAR | Status: DC | PRN

## 2024-01-06 MED ORDER — DEXAMETHASONE SODIUM PHOSPHATE 10 MG/ML IJ SOLN
INTRAMUSCULAR | Status: AC
  Filled 2024-01-06: qty 1

## 2024-01-06 MED ORDER — DIPHENHYDRAMINE HCL 12.5 MG/5ML PO ELIX: 12.5000 mg | ORAL_SOLUTION | ORAL | Status: DC | PRN

## 2024-01-06 MED ORDER — CEFAZOLIN SODIUM-DEXTROSE 2-4 GM/100ML-% IV SOLN
2.0000 g | Freq: Four times a day (QID) | INTRAVENOUS | Status: AC
  Administered 2024-01-06 (×4): 2 g via INTRAVENOUS
  Filled 2024-01-06 (×2): qty 100

## 2024-01-06 MED ORDER — GABAPENTIN 300 MG PO CAPS
ORAL_CAPSULE | ORAL | Status: AC
Start: 2024-01-06 — End: 2024-01-06
  Filled 2024-01-06: qty 1

## 2024-01-06 MED ORDER — LOSARTAN POTASSIUM 50 MG PO TABS
50.0000 mg | ORAL_TABLET | Freq: Every day | ORAL | Status: DC
  Administered 2024-01-07: 50 mg via ORAL
  Filled 2024-01-06 (×2): qty 1

## 2024-01-06 MED ORDER — TRANEXAMIC ACID-NACL 1000-0.7 MG/100ML-% IV SOLN
1000.0000 mg | Freq: Once | INTRAVENOUS | Status: AC
  Administered 2024-01-06 (×2): 1000 mg via INTRAVENOUS

## 2024-01-06 MED ORDER — ASPIRIN 81 MG PO CHEW
81.0000 mg | CHEWABLE_TABLET | Freq: Two times a day (BID) | ORAL | Status: DC
  Administered 2024-01-06 – 2024-01-07 (×3): 81 mg via ORAL
  Filled 2024-01-06 (×2): qty 1

## 2024-01-06 MED ORDER — SURGIPHOR WOUND IRRIGATION SYSTEM - OPTIME: TOPICAL | Status: DC | PRN

## 2024-01-06 MED ORDER — ONDANSETRON HCL 4 MG PO TABS: 4.0000 mg | ORAL_TABLET | Freq: Four times a day (QID) | ORAL | Status: DC | PRN

## 2024-01-06 MED ORDER — DROPERIDOL 2.5 MG/ML IJ SOLN: 0.6250 mg | Freq: Once | INTRAMUSCULAR | Status: DC | PRN

## 2024-01-06 MED ORDER — ACETAMINOPHEN 10 MG/ML IV SOLN
INTRAVENOUS | Status: DC | PRN
  Administered 2024-01-06 (×2): 1000 mg via INTRAVENOUS

## 2024-01-06 MED ORDER — CLOPIDOGREL BISULFATE 75 MG PO TABS
75.0000 mg | ORAL_TABLET | Freq: Every day | ORAL | Status: DC
  Administered 2024-01-07: 75 mg via ORAL
  Filled 2024-01-06: qty 1

## 2024-01-06 MED ORDER — METOCLOPRAMIDE HCL 10 MG PO TABS
10.0000 mg | ORAL_TABLET | Freq: Three times a day (TID) | ORAL | Status: DC
  Administered 2024-01-06 – 2024-01-07 (×5): 10 mg via ORAL
  Filled 2024-01-06 (×3): qty 1

## 2024-01-06 MED ORDER — PROPOFOL 1000 MG/100ML IV EMUL
INTRAVENOUS | Status: AC
  Filled 2024-01-06: qty 100

## 2024-01-06 MED ORDER — LACTATED RINGERS IV SOLN: INTRAVENOUS | Status: DC

## 2024-01-06 MED ORDER — ATORVASTATIN CALCIUM 20 MG PO TABS
40.0000 mg | ORAL_TABLET | Freq: Every day | ORAL | Status: DC
  Administered 2024-01-06 (×2): 40 mg via ORAL
  Filled 2024-01-06: qty 4

## 2024-01-06 MED ORDER — BUPIVACAINE HCL (PF) 0.25 % IJ SOLN
INTRAMUSCULAR | Status: AC
  Filled 2024-01-06: qty 60

## 2024-01-06 MED ORDER — OXYCODONE HCL 5 MG PO TABS: 10.0000 mg | ORAL_TABLET | ORAL | Status: DC | PRN

## 2024-01-06 MED ORDER — OXYCODONE HCL 5 MG PO TABS
5.0000 mg | ORAL_TABLET | ORAL | Status: DC | PRN
  Administered 2024-01-06 (×2): 5 mg via ORAL
  Filled 2024-01-06: qty 1

## 2024-01-06 MED ORDER — ONDANSETRON HCL 4 MG/2ML IJ SOLN: 4.0000 mg | Freq: Four times a day (QID) | INTRAMUSCULAR | Status: DC | PRN

## 2024-01-06 MED ORDER — FENTANYL CITRATE (PF) 100 MCG/2ML IJ SOLN
INTRAMUSCULAR | Status: AC
Start: 2024-01-06 — End: 2024-01-06
  Filled 2024-01-06: qty 2

## 2024-01-06 MED ORDER — PHENYLEPHRINE HCL-NACL 20-0.9 MG/250ML-% IV SOLN
INTRAVENOUS | Status: AC
  Filled 2024-01-06: qty 250

## 2024-01-06 MED ORDER — SODIUM CHLORIDE (PF) 0.9 % IJ SOLN
INTRAMUSCULAR | Status: DC | PRN
  Administered 2024-01-06 (×2): 120 mL via INTRAMUSCULAR

## 2024-01-06 MED ORDER — CEFAZOLIN SODIUM-DEXTROSE 2-4 GM/100ML-% IV SOLN
INTRAVENOUS | Status: AC
  Filled 2024-01-06: qty 100

## 2024-01-06 MED ORDER — TRANEXAMIC ACID-NACL 1000-0.7 MG/100ML-% IV SOLN
INTRAVENOUS | Status: DC | PRN
  Administered 2024-01-06 (×2): 1000 mg via INTRAVENOUS

## 2024-01-06 MED ORDER — PHENOL 1.4 % MT LIQD: 1.0000 | OROMUCOSAL | Status: DC | PRN

## 2024-01-06 MED ORDER — MIDAZOLAM HCL 5 MG/5ML IJ SOLN
INTRAMUSCULAR | Status: DC | PRN
Start: 2024-01-06 — End: 2024-01-06
  Administered 2024-01-06 (×2): 2 mg via INTRAVENOUS

## 2024-01-06 MED ORDER — CELECOXIB 200 MG PO CAPS
ORAL_CAPSULE | ORAL | Status: AC
Start: 2024-01-06 — End: 2024-01-06
  Filled 2024-01-06: qty 2

## 2024-01-06 MED ORDER — ACETAMINOPHEN 325 MG PO TABS: 325.0000 mg | ORAL_TABLET | Freq: Four times a day (QID) | ORAL | Status: DC | PRN

## 2024-01-06 MED ORDER — ACETAMINOPHEN 10 MG/ML IV SOLN: 1000.0000 mg | Freq: Once | INTRAVENOUS | Status: DC | PRN

## 2024-01-06 MED ORDER — CELECOXIB 200 MG PO CAPS
200.0000 mg | ORAL_CAPSULE | Freq: Two times a day (BID) | ORAL | Status: DC
  Administered 2024-01-06 – 2024-01-07 (×3): 200 mg via ORAL
  Filled 2024-01-06 (×2): qty 1

## 2024-01-06 MED ORDER — FLEET ENEMA RE ENEM: 1.0000 | ENEMA | Freq: Once | RECTAL | Status: DC | PRN

## 2024-01-06 MED ORDER — ENSURE PRE-SURGERY PO LIQD
296.0000 mL | Freq: Once | ORAL | Status: DC
  Filled 2024-01-06: qty 296

## 2024-01-06 MED ORDER — PHENYLEPHRINE HCL-NACL 20-0.9 MG/250ML-% IV SOLN
INTRAVENOUS | Status: DC | PRN
  Administered 2024-01-06 (×2): 20 ug/min via INTRAVENOUS

## 2024-01-06 MED ORDER — ACETAMINOPHEN 10 MG/ML IV SOLN
INTRAVENOUS | Status: AC
  Filled 2024-01-06: qty 100

## 2024-01-06 MED ORDER — ALUM & MAG HYDROXIDE-SIMETH 200-200-20 MG/5ML PO SUSP: 30.0000 mL | ORAL | Status: DC | PRN

## 2024-01-06 MED ORDER — FENTANYL CITRATE (PF) 100 MCG/2ML IJ SOLN
INTRAMUSCULAR | Status: DC | PRN
  Administered 2024-01-06: 50 ug via INTRAVENOUS
  Administered 2024-01-06 (×4): 25 ug via INTRAVENOUS
  Administered 2024-01-06: 50 ug via INTRAVENOUS

## 2024-01-06 MED ORDER — BISACODYL 10 MG RE SUPP: 10.0000 mg | Freq: Every day | RECTAL | Status: DC | PRN

## 2024-01-06 MED ORDER — PANTOPRAZOLE SODIUM 40 MG PO TBEC
40.0000 mg | DELAYED_RELEASE_TABLET | Freq: Two times a day (BID) | ORAL | Status: DC
  Administered 2024-01-06 – 2024-01-07 (×5): 40 mg via ORAL
  Filled 2024-01-06 (×3): qty 1

## 2024-01-06 MED ORDER — MIDAZOLAM HCL 2 MG/2ML IJ SOLN
INTRAMUSCULAR | Status: AC
  Filled 2024-01-06: qty 2

## 2024-01-06 MED ORDER — MENTHOL 3 MG MT LOZG: 1.0000 | LOZENGE | OROMUCOSAL | Status: DC | PRN

## 2024-01-06 MED ORDER — METOPROLOL SUCCINATE ER 25 MG PO TB24
25.0000 mg | ORAL_TABLET | Freq: Every day | ORAL | Status: DC
  Administered 2024-01-07: 25 mg via ORAL
  Filled 2024-01-06: qty 1

## 2024-01-06 MED ORDER — OXYCODONE HCL 5 MG PO TABS
5.0000 mg | ORAL_TABLET | Freq: Once | ORAL | Status: AC | PRN
  Administered 2024-01-06 (×2): 5 mg via ORAL

## 2024-01-06 MED ORDER — PROPOFOL 500 MG/50ML IV EMUL
INTRAVENOUS | Status: DC | PRN
  Administered 2024-01-06: 10 mg via INTRAVENOUS
  Administered 2024-01-06: 75 ug/kg/min via INTRAVENOUS
  Administered 2024-01-06: 20 mg via INTRAVENOUS
  Administered 2024-01-06: 40 mg via INTRAVENOUS
  Administered 2024-01-06: 20 mg via INTRAVENOUS
  Administered 2024-01-06: 10 mg via INTRAVENOUS
  Administered 2024-01-06: 40 mg via INTRAVENOUS
  Administered 2024-01-06: 75 ug/kg/min via INTRAVENOUS
  Administered 2024-01-06: 30 mg via INTRAVENOUS

## 2024-01-06 MED ORDER — MAGNESIUM HYDROXIDE 400 MG/5ML PO SUSP
30.0000 mL | Freq: Every day | ORAL | Status: DC
  Administered 2024-01-07: 30 mL via ORAL
  Filled 2024-01-06: qty 30

## 2024-01-06 MED ORDER — ACETAMINOPHEN 10 MG/ML IV SOLN
1000.0000 mg | Freq: Four times a day (QID) | INTRAVENOUS | Status: AC
  Administered 2024-01-06 – 2024-01-07 (×6): 1000 mg via INTRAVENOUS
  Filled 2024-01-06 (×3): qty 100

## 2024-01-06 MED ORDER — OXYCODONE HCL 5 MG/5ML PO SOLN: 5.0000 mg | Freq: Once | ORAL | Status: AC | PRN

## 2024-01-06 MED ORDER — SENNOSIDES-DOCUSATE SODIUM 8.6-50 MG PO TABS
1.0000 | ORAL_TABLET | Freq: Two times a day (BID) | ORAL | Status: DC
  Administered 2024-01-06 – 2024-01-07 (×5): 1 via ORAL
  Filled 2024-01-06 (×3): qty 1

## 2024-01-06 MED ORDER — TRAMADOL HCL 50 MG PO TABS: 50.0000 mg | ORAL_TABLET | ORAL | Status: DC | PRN

## 2024-01-06 MED ORDER — BUPIVACAINE HCL (PF) 0.5 % IJ SOLN
INTRAMUSCULAR | Status: AC
  Filled 2024-01-06: qty 10

## 2024-01-06 MED ORDER — POLYVINYL ALCOHOL 1.4 % OP SOLN: 1.0000 [drp] | Freq: Three times a day (TID) | OPHTHALMIC | Status: DC | PRN

## 2024-01-06 SURGICAL SUPPLY — 63 items
ATTUNE MED DOME PAT 41 KNEE (Knees) IMPLANT
ATTUNE PS FEM RT SZ 7 CEM KNEE (Femur) IMPLANT
ATTUNE PSRP INSR SZ7 5 KNEE (Insert) IMPLANT
BASE TIBIAL ROT PLAT SZ 8 KNEE (Knees) IMPLANT
BATTERY INSTRU NAVIGATION (MISCELLANEOUS) ×4 IMPLANT
BIT DRILL QUICK REL 1/8 2PK SL (BIT) ×1 IMPLANT
BLADE CLIPPER SURG (BLADE) IMPLANT
BLADE SAW 70X12.5 (BLADE) ×1 IMPLANT
BLADE SAW 90X13X1.19 OSCILLAT (BLADE) ×1 IMPLANT
BLADE SAW 90X25X1.19 OSCILLAT (BLADE) ×1 IMPLANT
BRUSH SCRUB EZ PLAIN DRY (MISCELLANEOUS) ×1 IMPLANT
CEMENT BONE GENTAMICIN 40 (Cement) IMPLANT
COOLER ICEMAN CLASSIC (MISCELLANEOUS) ×1 IMPLANT
CUFF TRNQT CYL 24X4X16.5-23 (TOURNIQUET CUFF) IMPLANT
CUFF TRNQT CYL 30X4X21-28X (TOURNIQUET CUFF) IMPLANT
DRAPE SHEET LG 3/4 BI-LAMINATE (DRAPES) ×1 IMPLANT
DRSG AQUACEL AG ADV 3.5X14 (GAUZE/BANDAGES/DRESSINGS) ×1 IMPLANT
DRSG MEPILEX SACRM 8.7X9.8 (GAUZE/BANDAGES/DRESSINGS) ×1 IMPLANT
DRSG TEGADERM 4X4.75 (GAUZE/BANDAGES/DRESSINGS) ×1 IMPLANT
DURAPREP 26ML APPLICATOR (WOUND CARE) ×2 IMPLANT
ELECT CAUTERY BLADE 6.4 (BLADE) ×1 IMPLANT
ELECTRODE REM PT RTRN 9FT ADLT (ELECTROSURGICAL) ×1 IMPLANT
EVACUATOR 1/8 PVC DRAIN (DRAIN) ×1 IMPLANT
EX-PIN ORTHOLOCK NAV 4X150 (PIN) ×2 IMPLANT
GAUZE XEROFORM 1X8 LF (GAUZE/BANDAGES/DRESSINGS) ×1 IMPLANT
GLOVE BIOGEL M STRL SZ7.5 (GLOVE) ×6 IMPLANT
GLOVE BIOGEL PI IND STRL 8 (GLOVE) ×1 IMPLANT
GLOVE SRG 8 PF TXTR STRL LF DI (GLOVE) ×1 IMPLANT
GOWN STRL REUS W/ TWL LRG LVL3 (GOWN DISPOSABLE) ×1 IMPLANT
GOWN STRL REUS W/ TWL XL LVL3 (GOWN DISPOSABLE) ×1 IMPLANT
GOWN TOGA ZIPPER T7+ PEEL AWAY (MISCELLANEOUS) ×1 IMPLANT
HOLDER FOLEY CATH W/STRAP (MISCELLANEOUS) ×1 IMPLANT
HOOD PEEL AWAY T7 (MISCELLANEOUS) ×1 IMPLANT
KIT TURNOVER KIT A (KITS) ×1 IMPLANT
KNIFE SCULPS 14X20 (INSTRUMENTS) ×1 IMPLANT
MANIFOLD NEPTUNE II (INSTRUMENTS) ×2 IMPLANT
NDL SPNL 20GX3.5 QUINCKE YW (NEEDLE) ×2 IMPLANT
NEEDLE SPNL 20GX3.5 QUINCKE YW (NEEDLE) ×2 IMPLANT
PACK TOTAL KNEE (MISCELLANEOUS) ×1 IMPLANT
PAD ABD DERMACEA PRESS 5X9 (GAUZE/BANDAGES/DRESSINGS) ×2 IMPLANT
PAD ARMBOARD POSITIONER FOAM (MISCELLANEOUS) ×3 IMPLANT
PAD COLD UNI WRAP-ON (PAD) ×1 IMPLANT
PENCIL SMOKE EVACUATOR COATED (MISCELLANEOUS) ×1 IMPLANT
PIN DRILL FIX HALF THREAD (BIT) ×2 IMPLANT
PIN FIXATION 1/8DIA X 3INL (PIN) ×1 IMPLANT
SOL .9 NS 3000ML IRR UROMATIC (IV SOLUTION) ×1 IMPLANT
SOLUTION IRRIG SURGIPHOR (IV SOLUTION) ×1 IMPLANT
SPONGE DRAIN TRACH 4X4 STRL 2S (GAUZE/BANDAGES/DRESSINGS) ×1 IMPLANT
STAPLER SKIN PROX 35W (STAPLE) ×1 IMPLANT
STOCKINETTE IMPERV 14X48 (MISCELLANEOUS) ×1 IMPLANT
STOCKINETTE STRL BIAS CUT 8X4 (MISCELLANEOUS) ×1 IMPLANT
STRAP TIBIA SHORT (MISCELLANEOUS) ×1 IMPLANT
SUCTION TUBE FRAZIER 10FR DISP (SUCTIONS) ×1 IMPLANT
SUT VIC AB 0 CT1 36 (SUTURE) ×1 IMPLANT
SUT VIC AB 1 CT1 36 (SUTURE) ×2 IMPLANT
SUT VIC AB 2-0 CT2 27 (SUTURE) ×1 IMPLANT
SYR 30ML LL (SYRINGE) ×2 IMPLANT
TIP FAN IRRIG PULSAVAC PLUS (DISPOSABLE) ×1 IMPLANT
TOWEL OR 17X26 4PK STRL BLUE (TOWEL DISPOSABLE) IMPLANT
TOWER CARTRIDGE SMART MIX (DISPOSABLE) ×1 IMPLANT
TRAP FLUID SMOKE EVACUATOR (MISCELLANEOUS) ×1 IMPLANT
TRAY FOLEY MTR SLVR 16FR STAT (SET/KITS/TRAYS/PACK) ×1 IMPLANT
WATER STERILE IRR 1000ML POUR (IV SOLUTION) ×1 IMPLANT

## 2024-01-06 NOTE — Evaluation (Signed)
 Physical Therapy Evaluation Patient Details Name: Leonard Hughes MRN: [id number] DOB: [birth year] Today's Date: 01/06/2024  History of Present Illness  Pt is a 78 y.o. male s/p R TKA on 01/06/24.  Clinical Impression  Pt admitted with above diagnosis. Pt currently with functional limitations due to the deficits listed below (see PT Problem List). Pt received upright in bed agreeable to PT. Daughter present. PTA pt fully independent. Daughter and SIL available to help.   To date, began session reviewing education as listed below and performing RLE therex. ROM 4-60 degrees in supine. Able to transfer to EOB mod-I and STS and SPT at [facility] with VC's for hand placement. Excellent foot clearance in swing phase and Wb'ing on RLE during transfer to recliner. Pt in recliner with all needs with R knee in extension and polar care applied. Pending gait progression and stairs training anticipate pt will be safe to d/c to expected venue to address gait, ROM, and strength deficits.      If plan is discharge home, recommend the following: A little help with walking and/or transfers;A little help with bathing/dressing/bathroom;Assistance with cooking/housework;Help with stairs or ramp for entrance   Can travel by private vehicle        Equipment Recommendations BSC/3in1  Recommendations for Other Services       Functional Status Assessment Patient has had a recent decline in their functional status and demonstrates the ability to make significant improvements in function in a reasonable and predictable amount of time.     Precautions / Restrictions Precautions Precautions: Knee Precaution Booklet Issued: Yes (comment) Recall of Precautions/Restrictions: Intact Restrictions Weight Bearing Restrictions Per Provider Order: Yes RLE Weight Bearing Per Provider Order: Weight bearing as tolerated      Mobility  Bed Mobility Overal bed mobility: Modified Independent             General bed mobility  comments: with bed features Patient Response: Cooperative  Transfers Overall transfer level: Needs assistance Equipment used: Rolling walker (2 wheels) Transfers: Sit to/from Stand, Bed to chair/wheelchair/BSC Sit to Stand: Contact guard assist   Step pivot transfers: Contact guard assist       General transfer comment: VC's for hand placement    Ambulation/Gait                  Stairs            Wheelchair Mobility     Tilt Bed Tilt Bed Patient Response: Cooperative  Modified Rankin (Stroke Patients Only)       Balance Overall balance assessment: Mild deficits observed, not formally tested                                           Pertinent Vitals/Pain Pain Assessment Pain Assessment: Faces Faces Pain Scale: Hurts a little bit Pain Location: R knee Pain Descriptors / Indicators: Aching, Discomfort Pain Intervention(s): Limited activity within patient's tolerance, Monitored during session, Repositioned, Ice applied    Home Living Family/patient expects to be discharged to:: Private residence Living Arrangements: Alone Available Help at Discharge: Family;Available PRN/intermittently Type of Home: House Home Access: Stairs to enter   [employer] of Steps: 2 from garage (no railing); 3-4 at front door with L railing   Home Layout: Two level;Able to live on main level with bedroom/bathroom Home Equipment: Rolling Walker (2 wheels);Cane - single point  Prior Function Prior Level of Function : Independent/Modified Independent                     Extremity/Trunk Assessment   Upper Extremity Assessment Upper Extremity Assessment: Overall WFL for tasks assessed    Lower Extremity Assessment Lower Extremity Assessment: RLE deficits/detail RLE Deficits / Details: expected ROM and strength deficits post op       Communication   Communication Communication: No apparent difficulties    Cognition  Arousal: Alert Behavior During Therapy: WFL for tasks assessed/performed   PT - Cognitive impairments: No apparent impairments                         Following commands: Intact       Cueing Cueing Techniques: Verbal cues     General Comments General comments (skin integrity, edema, etc.): R knee ace wrap in place with drain    Exercises Total Joint Exercises Ankle Circles/Pumps: AROM, Both, 10 reps, Supine Quad Sets: AROM, Strengthening, Right, 10 reps, Supine Heel Slides: AROM, Strengthening, Right, 10 reps, Supine Hip ABduction/ADduction: AROM, Strengthening, Right, 10 reps, Supine Goniometric ROM: 4-60 Other Exercises Other Exercises: Role of PT in acute setting, WB status, Knee positioning to prevent contracture, HEP (reps/sets/frequency), polar care, DME needs   Assessment/Plan    PT Assessment Patient needs continued PT services  PT Problem List Decreased strength;Decreased mobility;Decreased range of motion;Decreased balance;Pain       PT Treatment Interventions DME instruction;Therapeutic exercise;Gait training;Balance training;Stair training;Neuromuscular re-education;Functional mobility training;Therapeutic activities;Patient/family education    PT Goals (Current goals can be found in the Care Plan section)  Acute Rehab PT Goals Patient Stated Goal: to go home PT Goal Formulation: With patient Time For Goal Achievement: 01/20/24 Potential to Achieve Goals: Good    Frequency BID     Co-evaluation               AM-PAC PT 6 Clicks Mobility  Outcome Measure Help needed turning from your back to your side while in a flat bed without using bedrails?: A Little Help needed moving from lying on your back to sitting on the side of a flat bed without using bedrails?: A Little Help needed moving to and from a bed to a chair (including a wheelchair)?: A Little Help needed standing up from a chair using your arms (e.g., wheelchair or bedside chair)?:  A Little Help needed to walk in hospital room?: A Little Help needed climbing 3-5 steps with a railing? : A Little 6 Click Score: 18    End of Session Equipment Utilized During Treatment: Gait belt Activity Tolerance: Patient tolerated treatment well Patient left: in chair;with call bell/phone within reach;with family/visitor present Nurse Communication: Mobility status PT Visit Diagnosis: Muscle weakness (generalized) (M62.81);Difficulty in walking, not elsewhere classified (R26.2);Pain Pain - Right/Left: Right Pain - part of body: Knee    Time: 8469-8441 PT Time Calculation (min) (ACUTE ONLY): 28 min   Charges:   PT Evaluation $PT Eval Low Complexity: 1 Low PT Treatments $Therapeutic Exercise: 8-22 mins PT General Charges $$ ACUTE PT VISIT: 1 Visit        Dorina HERO. Fairly IV, PT, DPT Physical Therapist- [facility] 01/06/2024, 4:14 PM

## 2024-01-06 NOTE — Transfer of Care (Signed)
 Immediate Anesthesia Transfer of Care Note  Patient: Leonard Hughes  Procedure(s) Performed: ARTHROPLASTY, KNEE, TOTAL, USING IMAGELESS COMPUTER-ASSISTED NAVIGATION (Right: Knee)  Patient Location: PACU  Anesthesia Type:Spinal  Level of Consciousness: awake and patient cooperative  Airway & Oxygen Therapy: Patient Spontanous Breathing  Post-op Assessment: Report given to RN and Post -op Vital signs reviewed and stable  Post vital signs: Reviewed and stable  Last Vitals:  Vitals Value Taken Time  BP 129/50 01/06/24 11:23  Temp    Pulse 63 01/06/24 11:26  Resp 15 01/06/24 11:26  SpO2 96 % 01/06/24 11:26  Vitals shown include unfiled device data.  Last Pain:  Vitals:   01/06/24 0635  TempSrc: Temporal  PainSc: 0-No pain         Complications: No notable events documented.

## 2024-01-06 NOTE — Op Note (Signed)
 OPERATIVE NOTE  DATE OF SURGERY:  01/06/2024  PATIENT NAME:  Leonard Hughes   DOB: 09/07/1945  MRN: [id number]  PRE-OPERATIVE DIAGNOSIS: Degenerative arthrosis of the right knee, primary  POST-OPERATIVE DIAGNOSIS:  Same  PROCEDURE:  Right total knee arthroplasty using computer-assisted navigation  SURGEON:  Lynwood SHAUNNA Mardee Mickey. M.D.  ASSISTANT:  Sidra Koyanagi, PA-C (present and scrubbed throughout the case, critical for assistance with exposure, retraction, instrumentation, and closure)  ANESTHESIA: spinal  ESTIMATED BLOOD LOSS: 50 mL  FLUIDS REPLACED: 900 mL of crystalloid  TOURNIQUET TIME: 97 minutes  DRAINS: 2 medium Hemovac drains  SOFT TISSUE RELEASES: Anterior cruciate ligament, posterior cruciate ligament, deep medial collateral ligament, patellofemoral ligament  IMPLANTS UTILIZED: DePuy Attune size 7 posterior stabilized femoral component (cemented), size 8 rotating platform tibial component (cemented), 41 mm medialized dome patella (cemented), and a 5 mm stabilized rotating platform polyethylene insert.  INDICATIONS FOR SURGERY: Leonard Hughes is a 78 y.o. year old male with a long history of progressive knee pain. X-rays demonstrated severe degenerative changes in tricompartmental fashion. The patient had not seen any significant improvement despite conservative nonsurgical intervention. After discussion of the risks and benefits of surgical intervention, the patient expressed understanding of the risks benefits and agree with plans for total knee arthroplasty.   The risks, benefits, and alternatives were discussed at length including but not limited to the risks of infection, bleeding, nerve injury, stiffness, blood clots, the need for revision surgery, cardiopulmonary complications, among others, and they were willing to proceed.  PROCEDURE IN DETAIL: The patient was brought into the operating room and, after adequate spinal anesthesia was achieved, a tourniquet was  placed on the patient's upper thigh. The patient's knee and leg were cleaned and prepped with alcohol  and DuraPrep and draped in the usual sterile fashion. A timeout was performed as per usual protocol. The lower extremity was exsanguinated using an Esmarch, and the tourniquet was inflated to 300 mmHg. An anterior longitudinal incision was made followed by a standard mid vastus approach. The deep fibers of the medial collateral ligament were elevated in a subperiosteal fashion off of the medial flare of the tibia so as to maintain a continuous soft tissue sleeve. The patella was subluxed laterally and the patellofemoral ligament was incised. Inspection of the knee demonstrated severe degenerative changes with full-thickness loss of articular cartilage. Osteophytes were debrided using a rongeur. Anterior and posterior cruciate ligaments were excised. Two 4.0 mm Schanz pins were inserted in the femur and into the tibia for attachment of the array of trackers used for computer-assisted navigation. Hip center was identified using a circumduction technique. Distal landmarks were mapped using the computer. The distal femur and proximal tibia were mapped using the computer. The distal femoral cutting guide was positioned using computer-assisted navigation so as to achieve a 5 distal valgus cut. The femur was sized and it was felt that a size 7 femoral component was appropriate. A size 7 femoral cutting guide was positioned and the anterior cut was performed and verified using the computer. This was followed by completion of the posterior and chamfer cuts. Femoral cutting guide for the central box was then positioned in the center box cut was performed.  Attention was then directed to the proximal tibia. Medial and lateral menisci were excised. The extramedullary tibial cutting guide was positioned using computer-assisted navigation so as to achieve a 0 varus-valgus alignment and 3 posterior slope. The cut was  performed and verified using the computer. The proximal tibia  was sized and it was felt that a size 8 tibial tray was appropriate. Tibial and femoral trials were inserted followed by insertion of a 5 mm polyethylene insert. This allowed for excellent mediolateral soft tissue balancing both in flexion and in full extension. Finally, the patella was cut and prepared so as to accommodate a 41 mm medialized dome patella. A patella trial was placed and the knee was placed through a range of motion with excellent patellar tracking appreciated. The femoral trial was removed after debridement of posterior osteophytes. The central post-hole for the tibial component was reamed followed by insertion of a keel punch. Tibial trials were then removed. Cut surfaces of bone were irrigated with copious amounts of normal saline using pulsatile lavage and then suctioned dry. Polymethylmethacrylate cement with gentamicin was prepared in the usual fashion using a vacuum mixer. Cement was applied to the cut surface of the proximal tibia as well as along the undersurface of a size 8 rotating platform tibial component. Tibial component was positioned and impacted into place. Excess cement was removed using Personal assistant. Cement was then applied to the cut surfaces of the femur as well as along the posterior flanges of the size 7 femoral component. The femoral component was positioned and impacted into place. Excess cement was removed using Personal assistant. A 5 mm polyethylene trial was inserted and the knee was brought into full extension with steady axial compression applied. Finally, cement was applied to the backside of a 41 mm medialized dome patella and the patellar component was positioned and patellar clamp applied. Excess cement was removed using Personal assistant. After adequate curing of the cement, the tourniquet was deflated after a total tourniquet time of 97 minutes. Hemostasis was achieved using electrocautery. The knee was  irrigated with copious amounts of normal saline using pulsatile lavage followed by 450 ml of Surgiphor and then suctioned dry. 20 mL of 1.3% Exparel  and 60 mL of 0.25% Marcaine  in 40 mL of normal saline was injected along the posterior capsule, medial and lateral gutters, and along the arthrotomy site. A 5 mm stabilized rotating platform polyethylene insert was inserted and the knee was placed through a range of motion with excellent mediolateral soft tissue balancing appreciated and excellent patellar tracking noted. 2 medium drains were placed in the wound bed and brought out through separate stab incisions. The medial parapatellar portion of the incision was reapproximated using interrupted sutures of #1 Vicryl. Subcutaneous tissue was approximated in layers using first #0 Vicryl followed #2-0 Vicryl. The skin was approximated with skin staples. A sterile dressing was applied.  The patient tolerated the procedure well and was transported to the recovery room in stable condition.    Zoe Creasman P. Mahoganie Basher, Jr., M.D.

## 2024-01-06 NOTE — Interval H&P Note (Signed)
 History and Physical Interval Note:  01/06/2024 6:09 AM  Leonard Hughes  has presented today for surgery, with the diagnosis of Primary osteoarthritis of right knee.  The various methods of treatment have been discussed with the patient and family. After consideration of risks, benefits and other options for treatment, the patient has consented to  Procedure(s): ARTHROPLASTY, KNEE, TOTAL, USING IMAGELESS COMPUTER-ASSISTED NAVIGATION (Right) as a surgical intervention.  The patient's history has been reviewed, patient examined, no change in status, stable for surgery.  I have reviewed the patient's chart and labs.  Questions were answered to the patient's satisfaction.     Luisfelipe Engelstad P Lincy Belles

## 2024-01-06 NOTE — Anesthesia Preprocedure Evaluation (Signed)
 Anesthesia Evaluation  Patient identified by MRN, date of birth, ID band Patient awake    Reviewed: Allergy & Precautions, H&P , NPO status , Patient's Chart, lab work & pertinent test results, reviewed documented beta blocker date and time   Airway Mallampati: II  TM Distance: >3 FB Neck ROM: full    Dental  (+) Teeth Intact   Pulmonary neg pulmonary ROS, former smoker   Pulmonary exam normal        Cardiovascular Exercise Tolerance: Good hypertension, + CAD  Normal cardiovascular exam+ Valvular Problems/Murmurs  Rhythm:regular Rate:Normal     Neuro/Psych  Neuromuscular disease  negative psych ROS   GI/Hepatic negative GI ROS, Neg liver ROS,,,  Endo/Other  negative endocrine ROS    Renal/GU negative Renal ROS  negative genitourinary   Musculoskeletal   Abdominal   Peds  Hematology negative hematology ROS (+)   Anesthesia Other Findings Past Medical History: No date: Aortic stenosis 2001: Coronary artery disease     Comment:  a.) 95% mLCx (3.0 x 15 mm S670 BMS); b.) 2v CABG               (RIMA-LAD, LIMA-OM1) 07/30/2005 No date: DDD (degenerative disc disease), lumbosacral No date: Diastolic dysfunction No date: GERD (gastroesophageal reflux disease) No date: Heart murmur No date: History of chicken pox No date: Hyperlipidemia No date: Hypertension No date: Long-term use of aspirin  therapy No date: Pre-diabetes No date: Primary osteoarthritis of right knee 07/30/2005: S/P CABG x 2     Comment:  a.) 2v CABG (RIMA-LAD, LIMA-OM1) 07/30/2005 Past Surgical History: 12/01/2017: COLONOSCOPY WITH PROPOFOL ; N/A     Comment:  Procedure: COLONOSCOPY WITH PROPOFOL ;  Surgeon: Jinny Carmine, MD;  Location: [facility] END[facility];  Service:               Endoscopy;  Laterality: N/A; 02/19/2000: CORONARY ANGIOPLASTY WITH STENT PLACEMENT; Left     Comment:  Procedure: CORONARY ANGIOPLASTY WITH STENT PLACEMENT;                Location: [facility]; Surgeon: Marsa Dooms, MD 07/30/2005: CORONARY ARTERY BYPASS GRAFT; N/A     Comment:  Procedure: CORONARY ARTERY BYPASS GRAFT (RIMA-LAD,               LIMA-OM1); Location: [facility]; Surgeon: Elspeth Millers, MD No date: toe repair; Left     Comment:  due to injury 1970 No date: TONSILLECTOMY BMI    Body Mass Index: 32.19 kg/m     Reproductive/Obstetrics negative OB ROS                              Anesthesia Physical Anesthesia Plan  ASA: 3  Anesthesia Plan: Spinal   Post-op Pain Management:    Induction:   PONV Risk Score and Plan: 3  Airway Management Planned:   Additional Equipment:   Intra-op Plan:   Post-operative Plan:   Informed Consent: I have reviewed the patients History and Physical, chart, labs and discussed the procedure including the risks, benefits and alternatives for the proposed anesthesia with the patient or authorized representative who has indicated his/her understanding and acceptance.     Dental Advisory Given  Plan Discussed with: CRNA  Anesthesia Plan Comments:         Anesthesia Quick Evaluation

## 2024-01-06 NOTE — Anesthesia Procedure Notes (Signed)
 Procedure Name: MAC Date/Time: 01/06/2024 7:15 AM  Performed by: Lorrene Camelia LABOR, CRNAPre-anesthesia Checklist: Patient identified, Emergency Drugs available, Suction available and Patient being monitored Patient Re-evaluated:Patient Re-evaluated prior to induction Oxygen Delivery Method: Simple face mask Preoxygenation: Pre-oxygenation with 100% oxygen Induction Type: IV induction

## 2024-01-06 NOTE — H&P (Signed)
 ORTHOPAEDIC HISTORY & PHYSICAL Drake Fonda [place], [place] - 12/30/2023 3:45 PM EDT Formatting of this note is different from the original. NAME: Emon Lance H&P Date: 12/30/2023 Procedure Date: 01/06/2024  Chief Complaint: right knee pain and instability  HPI Leonard Hughes is a 78 y.o. male who has severe Right knee pain. Patient reports a longstanding history of right knee discomfort, but does report that it has gotten progressively worse over the last year. He states that most of the pain localizes along the lateral aspect of his knee. He does report intermittent swelling as well as some feelings of giving way. Denies any maltracking or locking symptoms. He states that the pain is made worse with prolonged weightbearing and standing as well as going up and down stairs. It greatly affects his ability to ambulate long distances and perform his ADLs as he would like. He has failed conservative treatment including bracing, topical NSAIDs, analgesic patches and activity modification. He is not utilizing any ambulatory aids during today's visit. He has requested operative intervention for relief of his DJD symptoms. Patient does have a significant cardiac history where he underwent open heart surgery with bypass in 2007. Denies any pulmonary history. No DVTs or clots. Noted to be a prediabetic with his last A1c at 5.8. He does have a history of a right arthroscopic knee debridement performed by Helayne Glenn over 20 years ago.  Social Hx: Patient lives at home by himself, but states that his daughter will be helping look after him postoperatively. He does admit to about 1 standard drink of alcohol  per day. Denies any illicit drug use, nicotine use or smoking.  Medications & Allergies Allergies: Allergies Allergen Reactions Rosuvastatin Other (See Comments) and Rash myalgias myalgias   Home Medicines: Current Outpatient Medications on File Prior to Visit Medication Sig Dispense Refill aspirin  81  MG EC tablet Take 81 mg by mouth once daily atorvastatin  (LIPITOR) 40 MG tablet TAKE 1 TABLET ONE TIME DAILY 90 tablet 3 clopidogreL  (PLAVIX ) 75 mg tablet TAKE 1 TABLET ONE TIME DAILY 90 tablet 3 ezetimibe  (ZETIA ) 10 mg tablet TAKE 1 TABLET ONE TIME DAILY 90 tablet 3 losartan  (COZAAR ) 50 MG tablet TAKE 1 TABLET ONE TIME DAILY 90 tablet 3 metoprolol  SUCCinate (TOPROL -XL) 25 MG XL tablet TAKE 1 TABLET ONE TIME DAILY 90 tablet 3 multivitamin tablet Take 1 tablet by mouth once daily omega-3 fatty acids  1,250 mg Cap Take 1 capsule by mouth once daily omeprazole  (PRILOSEC) 20 MG DR capsule TAKE 1 CAPSULE ONE TIME DAILY 90 capsule 3 oxymetazoline (AFRIN) 0.05 % nasal spray Place into both nostrils 2 (two) times daily pregabalin (LYRICA) 25 MG capsule Take 1 capsule (25 mg total) by mouth 3 (three) times daily 90 capsule 0 propylene glycoL (LUBRICANT EYE, PROPYL GLYCOL,) 0.6 % ophthalmic drops Place 2 drops into both eyes 3 (three) times daily as needed ASPIRIN  LOW DOSE ORAL Take 1 tablet by mouth once daily  No current facility-administered medications on file prior to visit.  Medical / Surgical History  Past Medical History: Diagnosis Date Carotid stenosis 11/01/2016 1-39% RICA, 40-59% LICA stenosis, rpt 59yr (10/2016) Coronary atherosclerosis of artery bypass graft 07/03/2009 Last Assessment & Plan: asxs. Continue aspirin , plavix , statin HLD (hyperlipidemia) 07/03/2009 Last Assessment & Plan: Chronic, stable. Continue current regimen. Followed by cardiology. Hypertension, benign 07/03/2009 Last Assessment & Plan: Chronic, stable. Continue current regimen. Low vitamin B12 level 09/25/2015   Past Surgical History: Procedure Laterality Date colonscopy CORONARY ARTERY BYPASS W/SINGLE ARTERY GRAFT VIA HEARTPORT  Physical Exam  Ht:180.3 cm (5' 11) Wt:(!) 103.7 kg (228 lb 9.6 oz) BMI: Body mass index is 31.88 kg/m.  General/Constitutional: No apparent distress: well-nourished and well  developed. Eyes: Pupils equal, round with synchronous movement. Lymphatic: No palpable adenopathy. Respiratory: Patient has good chest rise and fall with inspiration and expiration. All lung fields are clear to auscultation bilaterally. There is no Rales, rhonchi or wheezes appreciated. Cardiovascular: Upon auscultation there is a regular rate and rhythm without any murmurs, rubs, gallops or heaves appreciated. There does not appear to be any swelling down the lower extremities. Posterior tibial pulses appreciated bilaterally, 2+. Integumentary: No impressive skin lesions present, except as noted in detailed exam. Neuro/Psych: Normal mood and affect, oriented to person, place and time. Musculoskeletal: see exam below  Right knee exam Upon inspection of the patient's right knee there does not appear to be any skin changes, open abrasions, swelling or redness. There is a valgus alignment. Upon palpation, the patient reports having pain along the lateral aspect of their knee. Patient has 2 degrees off of full extension actively with ROM, and able to flex back to 122 with mild pain. Varus and valgus stress testing shows positive. The patella tracks well within the femoral groove from flexion into extension with mild crepitus. Anterior and posterior drawer testing negative. Patient is neurovascularly intact down their lower extremity to all dermatomes. Posterior tibial pulses appreciated 2+.  Imaging right Knee Imaging: None ordered today. Previous images from 11/06/2023 were reviewed. There is noticeable closure along the lateral cartilage space with near bone-on-bone articulation noted. Underlying subchondral changes appreciated. Osteophyte formation present. Overall alignment is relative valgus. No fractures, lytic lesions or gross deformities appreciated on films.  Assesment and Plan Knee DJD  I have recommended that Ozell Richmond undergo right total knee replacement. Consents has been signed. The  risks, benefits, prognosis and alternatives including but not limited to DVT, PE, infection, neurovascular injury, failure of the procedure and death were explained to the patient and he is willing to proceed with surgery as described to him by myself. Plan will be for post operative admission of at least 1 midnight for pain control and PT. He will be managed with DVT prophylaxis, antibiotics preoperatively for 24 hours and aggressive in patient rehab.  Pre, intra and post op interventions were discussed. Patient has good understanding  Medication Reconciliation was performed. Discussed cessation of Plavix , vitamins and supplements.  A total of 40 minutes was spent reviewing patient's charts, medical reconciliation, discussing/educating the patient about surgical interventions, and answering any questions provided by the patient.  JOSHUA DALLAS KOYANAGI, PA [facility] 12/30/2023  Electronically signed by KOYANAGI Fonda DALLAS, PA at 12/30/2023 4:31 PM EDT

## 2024-01-06 NOTE — Anesthesia Procedure Notes (Signed)
 Spinal  Patient location during procedure: OR Start time: 01/06/2024 7:20 AM End time: 01/06/2024 7:33 AM Reason for block: surgical anesthesia Staffing Performed: anesthesiologist  Anesthesiologist: Myra Lynwood MATSU, MD Resident/CRNA: Lorrene Camelia LABOR, CRNA Performed by: Lorrene Camelia LABOR, CRNA Authorized by: Myra Lynwood MATSU, MD   Preanesthetic Checklist Completed: patient identified, IV checked, site marked, risks and benefits discussed, surgical consent, monitors and equipment checked, pre-op evaluation and timeout performed Spinal Block Patient position: sitting Prep: Betadine Patient monitoring: heart rate, continuous pulse ox, blood pressure and cardiac monitor Approach: midline Location: L4-5 Injection technique: single-shot Needle Needle type: Quincke  Needle gauge: 22 G Needle length: 9 cm Assessment Events: CSF return Additional Notes Negative paresthesia. Negative blood return. Positive free-flowing CSF. Expiration date of kit checked and confirmed. Patient tolerated procedure well, without complications.

## 2024-01-06 NOTE — Progress Notes (Signed)
 Patient is not able to walk the distance required to go the bathroom, or he/she is unable to safely negotiate stairs required to access the bathroom.  A 3in1 BSC will alleviate this problem   Amenda Duclos P. Angie Fava M.D.

## 2024-01-07 ENCOUNTER — Encounter: Payer: Self-pay | Admitting: Orthopedic Surgery

## 2024-01-07 DIAGNOSIS — Z96651 Presence of right artificial knee joint: Secondary | ICD-10-CM | POA: Diagnosis not present

## 2024-01-07 DIAGNOSIS — I10 Essential (primary) hypertension: Secondary | ICD-10-CM | POA: Diagnosis not present

## 2024-01-07 DIAGNOSIS — Z7982 Long term (current) use of aspirin: Secondary | ICD-10-CM | POA: Diagnosis not present

## 2024-01-07 DIAGNOSIS — I2510 Atherosclerotic heart disease of native coronary artery without angina pectoris: Secondary | ICD-10-CM | POA: Diagnosis not present

## 2024-01-07 DIAGNOSIS — M1711 Unilateral primary osteoarthritis, right knee: Secondary | ICD-10-CM | POA: Diagnosis not present

## 2024-01-07 MED ORDER — CELECOXIB 200 MG PO CAPS: 200.0000 mg | ORAL_CAPSULE | Freq: Two times a day (BID) | ORAL | 1 refills | Status: AC

## 2024-01-07 MED ORDER — OXYCODONE HCL 5 MG PO TABS: 5.0000 mg | ORAL_TABLET | ORAL | 0 refills | Status: AC | PRN

## 2024-01-07 MED ORDER — TRAMADOL HCL 50 MG PO TABS: 50.0000 mg | ORAL_TABLET | ORAL | 0 refills | Status: AC | PRN

## 2024-01-07 MED ORDER — ACETAMINOPHEN 10 MG/ML IV SOLN
INTRAVENOUS | Status: AC
  Filled 2024-01-07: qty 100

## 2024-01-07 MED ORDER — ASPIRIN 81 MG PO TBEC: 81.0000 mg | DELAYED_RELEASE_TABLET | Freq: Two times a day (BID) | ORAL | Status: AC

## 2024-01-07 NOTE — Discharge Summary (Signed)
 Physician Discharge Summary  Subjective: 1 Day Post-Op Procedure(s) (LRB): ARTHROPLASTY, KNEE, TOTAL, USING IMAGELESS COMPUTER-ASSISTED NAVIGATION (Right) Patient reports pain as mild.   Patient seen in rounds with Dr. Mardee. Patient is well, and has had no acute complaints or problems Denies any CP, SOB, N/V, fevers or chills We will start therapy today.  Patient is ready to go home  Physician Discharge Summary  Patient ID: Leonard Hughes MRN: [id number] DOB/AGE: August 25, 1945 78 y.o.  Admit date: 01/06/2024 Discharge date: 01/07/2024  Admission Diagnoses:  Discharge Diagnoses:  Principal Problem:   History of total knee arthroplasty, right   Discharged Condition: good  Hospital Course: Patient presented to the hospital on 01/06/2024 for an elective right total knee arthoplasty performed by Dr. Mardee. Patient was given 1g of TXA and 2g of Ancef  prior to the procedure. he  tolerated the procedure well without any complications. See procedural note below for details. Postoperatively, the patient did very well. he  was able to pass PT protocols on post-op day one without any issues. JP drain was removed without any difficulty and was intact. he  was able to void his bladder without any difficulty. Physical exam was unremarkable. he  denies any SOB, CP, N/V, fevers or chills. Vital signs are stable. Patient is stable to discharge home.  PROCEDURE:  Right total knee arthroplasty using computer-assisted navigation   SURGEON:  Lynwood SHAUNNA Mardee Mickey. M.D.   ASSISTANT:  Sidra Koyanagi, PA-C (present and scrubbed throughout the case, critical for assistance with exposure, retraction, instrumentation, and closure)   ANESTHESIA: spinal   ESTIMATED BLOOD LOSS: 50 mL   FLUIDS REPLACED: 900 mL of crystalloid   TOURNIQUET TIME: 97 minutes   DRAINS: 2 medium Hemovac drains   SOFT TISSUE RELEASES: Anterior cruciate ligament, posterior cruciate ligament, deep medial collateral ligament,  patellofemoral ligament   IMPLANTS UTILIZED: DePuy Attune size 7 posterior stabilized femoral component (cemented), size 8 rotating platform tibial component (cemented), 41 mm medialized dome patella (cemented), and a 5 mm stabilized rotating platform polyethylene insert.  Treatments: none  Discharge Exam: Blood pressure (!) 147/56, pulse 60, temperature 97.8 F (36.6 C), temperature source Oral, resp. rate 17, height 5' 11 (1.803 m), weight 104.7 kg, SpO2 99%.   Disposition: home   Allergies as of 01/07/2024       Reactions   Crestor [rosuvastatin] Other (See Comments)   myalgias        Medication List     TAKE these medications    aspirin  EC 81 MG tablet Take 1 tablet (81 mg total) by mouth 2 (two) times daily. What changed: when to take this   atorvastatin  40 MG tablet Commonly known as: LIPITOR Take 1 tablet (40 mg total) by mouth daily. What changed: when to take this   celecoxib  200 MG capsule Commonly known as: CELEBREX  Take 1 capsule (200 mg total) by mouth 2 (two) times daily.   clopidogrel  75 MG tablet Commonly known as: PLAVIX  TAKE 1 TABLET EVERY DAY   ezetimibe  10 MG tablet Commonly known as: ZETIA  TAKE 1 TABLET EVERY DAY What changed: when to take this   FISH OIL PO Take 1,000 mg by mouth in the morning.   losartan  50 MG tablet Commonly known as: COZAAR  TAKE 1 TABLET EVERY DAY   Lubricant Eye Drop 0.6 % Soln Generic drug: Propylene Glycol Place 1-2 drops into both eyes 3 (three) times daily as needed (dry/irritate eyes.).   metoprolol  succinate 25 MG 24 hr tablet Commonly known  as: TOPROL -XL Take 1 tablet (25 mg total) by mouth daily.   multivitamin tablet Take 1 tablet by mouth in the morning.   omeprazole  20 MG capsule Commonly known as: PRILOSEC TAKE 1 CAPSULE EVERY DAY What changed: when to take this   oxyCODONE  5 MG immediate release tablet Commonly known as: Oxy IR/ROXICODONE  Take 1 tablet (5 mg total) by mouth every 4  (four) hours as needed for moderate pain (pain score 4-6) (pain score 4-6).   traMADol  50 MG tablet Commonly known as: ULTRAM  Take 1-2 tablets (50-100 mg total) by mouth every 4 (four) hours as needed for moderate pain (pain score 4-6).               Durable Medical Equipment  (From admission, onward)           Start     Ordered   01/06/24 1312  DME Walker rolling  Once       Question:  Patient needs a walker to treat with the following condition  Answer:  Total knee replacement status   01/06/24 1311   01/06/24 1312  DME Bedside commode  Once       Comments: Patient is not able to walk the distance required to go the bathroom, or he/she is unable to safely negotiate stairs required to access the bathroom.  A 3in1 BSC will alleviate this problem  Question:  Patient needs a bedside commode to treat with the following condition  Answer:  Total knee replacement status   01/06/24 1311            Follow-up Information     Drake Chew, PA-C Follow up on 01/21/2024.   Specialty: Orthopedic Surgery Why: at 9:45am Contact information: [address] [place] [address] [phone]         Mardee Lynwood SQUIBB, MD Follow up on 02/25/2024.   Specialty: Orthopedic Surgery Why: at 2:30pm Contact information: [address] [facility] [place] [address] [phone]                 Signed: Sidra Drake 01/07/2024, 8:23 AM   Objective: Vital signs in last 24 hours: Temp:  [97 F (36.1 C)-98.1 F (36.7 C)] 97.8 F (36.6 C) (08/14 0736) Pulse Rate:  [47-63] 60 (08/14 0736) Resp:  [11-23] 17 (08/14 0736) BP: (113-147)/(48-115) 147/56 (08/14 0736) SpO2:  [92 %-99 %] 99 % (08/14 0736)  Intake/Output from previous day:  Intake/Output Summary (Last 24 hours) at 01/07/2024 0823 Last data filed at 01/07/2024 0743 Gross per 24 hour  Intake 2146.67 ml  Output 2510 ml  Net -363.33 ml    Intake/Output this shift: Total I/O In: -   Out: 140 [Drains:140]  Labs: No results for input(s): HGB in the last 72 hours. No results for input(s): WBC, RBC, HCT, PLT in the last 72 hours. No results for input(s): NA, K, CL, CO2, BUN, CREATININE, GLUCOSE, CALCIUM  in the last 72 hours. No results for input(s): LABPT, INR in the last 72 hours.  EXAM: General - Patient is Alert, Appropriate, and Oriented Extremity - Neurologically intact Neurovascular intact Sensation intact distally Intact pulses distally Dorsiflexion/Plantar flexion intact No cellulitis present Compartment soft Dressing - dressing C/D/I and no drainage Motor Function - intact, moving foot and toes well on exam. JP Drain pulled without difficulty. Intact  Assessment/Plan: 1 Day Post-Op Procedure(s) (LRB): ARTHROPLASTY, KNEE, TOTAL, USING IMAGELESS COMPUTER-ASSISTED NAVIGATION (Right) Procedure(s) (LRB): ARTHROPLASTY, KNEE, TOTAL, USING IMAGELESS COMPUTER-ASSISTED NAVIGATION (Right) Past Medical  History:  Diagnosis Date   Aortic stenosis    Coronary artery disease 2001   a.) 95% mLCx (3.0 x 15 mm S670 BMS); b.) 2v CABG (RIMA-LAD, LIMA-OM1) 07/30/2005   DDD (degenerative disc disease), lumbosacral    Diastolic dysfunction    GERD (gastroesophageal reflux disease)    Heart murmur    History of chicken pox    Hyperlipidemia    Hypertension    Long-term use of aspirin  therapy    Pre-diabetes    Primary osteoarthritis of right knee    S/P CABG x 2 07/30/2005   a.) 2v CABG (RIMA-LAD, LIMA-OM1) 07/30/2005   Principal Problem:   History of total knee arthroplasty, right  Estimated body mass index is 32.19 kg/m as calculated from the following:   Height as of this encounter: 5' 11 (1.803 m).   Weight as of this encounter: 104.7 kg.  Patient will continue to work with physical therapy to pass postoperative PT protocols, ROM and strengthening   Discussed with the patient continuing to utilize Polar Care   Patient will  use bone foam in 20-30 minute intervals   Patient will wear TED hose bilaterally to help prevent DVT and clot formation   Discussed the Aquacel bandage.  This bandage will stay in place 7 days postoperatively.  Can be replaced with honeycomb bandages that will be sent home with the patient   Discussed sending the patient home with tramadol  and oxycodone  for as needed pain management.  Patient will also be sent home with Celebrex  to help with swelling and inflammation.  Patient will take an 81 mg aspirin  twice daily for DVT prophylaxis along with Plavix    JP drain removed without difficulty, intact   Weight-Bearing as tolerated to right leg   Patient will follow-up with [facility] clinic orthopedics in 2 weeks for staple removal and reevaluation  Diet - Regular diet Follow up - in 2 weeks Activity - WBAT Disposition - Home Condition Upon Discharge - Good DVT Prophylaxis - Aspirin , TED hose, and Plavix   Fonda CHARLENA Koyanagi, PA-C Orthopaedic Surgery 01/07/2024, 8:23 AM

## 2024-01-07 NOTE — TOC Transition Note (Signed)
 Transition of Care [facility]) - Discharge Note   Patient Details  Name: Leonard Hughes MRN: [id number] Date of Birth: 01-06-1946  Transition of Care [facility]) CM/SW Contact:  [place] L Jaleea Alesi, LCSW Phone Number: 01/07/2024, 11:37 AM   Clinical Narrative:     DME ordered and delivered for patient. Home Health was setup with [facility] prior to surgery by surgeon's office.   No further TOC needs identified. TOC signing off.          Patient Goals and CMS Choice            Discharge Placement                       Discharge Plan and Services Additional resources added to the After Visit Summary for                                       Social Drivers of Health (SDOH) Interventions SDOH Screenings   Food Insecurity: No Food Insecurity (01/06/2024)  Housing: Low Risk  (01/06/2024)  Transportation Needs: No Transportation Needs (01/06/2024)  Utilities: Not At Risk (01/06/2024)  Alcohol  Screen: Low Risk  (06/27/2021)  Depression (PHQ2-9): Low Risk  (06/27/2021)  Financial Resource Strain: Low Risk  (11/06/2023)   Received from [facility]  Physical Activity: Sufficiently Active (06/27/2021)  Social Connections: Moderately Isolated (01/06/2024)  Stress: No Stress Concern Present (06/27/2021)  Tobacco Use: Medium Risk (01/06/2024)     Readmission Risk Interventions     No data to display

## 2024-01-07 NOTE — Care Management Obs Status (Signed)
 MEDICARE OBSERVATION STATUS NOTIFICATION   Patient Details  Name: HUMZA TALLERICO MRN: [id number] Date of Birth: Mar 09, 1946   Medicare Observation Status Notification Given:  No (did not want a copy)    Rojelio SHAUNNA Rattler 01/07/2024, 11:01 AM

## 2024-01-07 NOTE — Progress Notes (Signed)
 Physical Therapy Treatment Patient Details Name: Leonard Hughes MRN: [id number] DOB: [birth year] Today's Date: 01/07/2024   History of Present Illness Pt is a 78 y.o. male s/p R TKA on 01/06/24.    PT Comments  Pt received upright in recliner agreeable to PT. Does have knees flexed in recliner. Re-educated pt and spouse on need for maintaining operated limb in TKE. Pt apologetic reporting he forgot. Able to stand at supervision to RW and ambulate ~200' round trip with RW mod-I with ability to perform step through gait and consistent RLE heel strike. Completed backwards and forwards techniques with safe LE sequencing and correct/safe completion. Pt returns to recliner with review of HEP. Pt has met all goals. RLE elevated in extension with polar care applied and all needs in reach. Pt has met all goals and safe to d/c to expected venue with f/u recs.    If plan is discharge home, recommend the following: A little help with walking and/or transfers;A little help with bathing/dressing/bathroom;Assistance with cooking/housework;Help with stairs or ramp for entrance   Can travel by private vehicle        Equipment Recommendations  BSC/3in1    Recommendations for Other Services       Precautions / Restrictions Precautions Precautions: Knee Precaution Booklet Issued: Yes (comment) Recall of Precautions/Restrictions: Intact Restrictions Weight Bearing Restrictions Per Provider Order: Yes RLE Weight Bearing Per Provider Order: Weight bearing as tolerated     Mobility  Bed Mobility               General bed mobility comments: NT. In recliner pre and post session Patient Response: Cooperative  Transfers Overall transfer level: Needs assistance Equipment used: Rolling walker (2 wheels) Transfers: Sit to/from Stand Sit to Stand: Supervision           General transfer comment: VC's for hand placement    Ambulation/Gait Ambulation/Gait assistance: Modified independent  (Device/Increase time) Gait Distance (Feet): 200 Feet Assistive device: Rolling walker (2 wheels) Gait Pattern/deviations: Step-through pattern       General Gait Details: able to initiate step therough pattern with consistent RLE heel strike at initial contact   Stairs Stairs: Yes Stairs assistance: Supervision Stair Management: No rails, One rail Left, Forwards, With walker Number of Stairs: 4 General stair comments: completed backwards method with RW and with single rail use with no AD. Family present. Understanding and safe with both techniques.   Wheelchair Mobility     Tilt Bed Tilt Bed Patient Response: Cooperative  Modified Rankin (Stroke Patients Only)       Balance Overall balance assessment: Mild deficits observed, not formally tested                                          Communication Communication Communication: No apparent difficulties  Cognition Arousal: Alert Behavior During Therapy: WFL for tasks assessed/performed   PT - Cognitive impairments: No apparent impairments                         Following commands: Intact      Cueing Cueing Techniques: Verbal cues  Exercises Total Joint Exercises Ankle Circles/Pumps: AROM, Both, 10 reps, Supine Quad Sets: AROM, Strengthening, Right, 10 reps, Supine Short Arc Quad: AROM, Strengthening, Right, 10 reps, Supine Heel Slides: AROM, Strengthening, Right, 10 reps, Supine Hip ABduction/ADduction: AROM, Strengthening, Right, 10 reps, Supine Straight  Leg Raises: AROM, Strengthening, Right, 10 reps, Supine Long Arc Quad: AROM, Strengthening, Right, Seated, 10 reps Goniometric ROM: 0-90    General Comments        Pertinent Vitals/Pain Pain Assessment Pain Assessment: No/denies pain    Home Living Family/patient expects to be discharged to:: Private residence Living Arrangements: Alone Available Help at Discharge: Family;Available PRN/intermittently Type of Home:  House Home Access: Stairs to enter   [employer] of Steps: 2 from garage (no railing); 3-4 at front door with L railing   Home Layout: Two level;Able to live on main level with bedroom/bathroom Home Equipment: Rolling Walker (2 wheels);Cane - single point;Adaptive equipment      Prior Function            PT Goals (current goals can now be found in the care plan section) Acute Rehab PT Goals Patient Stated Goal: to go home PT Goal Formulation: With patient Time For Goal Achievement: 01/20/24 Potential to Achieve Goals: Good Progress towards PT goals: Progressing toward goals    Frequency    BID      PT Plan      Co-evaluation              AM-PAC PT 6 Clicks Mobility   Outcome Measure  Help needed turning from your back to your side while in a flat bed without using bedrails?: A Little Help needed moving from lying on your back to sitting on the side of a flat bed without using bedrails?: A Little Help needed moving to and from a bed to a chair (including a wheelchair)?: A Little Help needed standing up from a chair using your arms (e.g., wheelchair or bedside chair)?: A Little Help needed to walk in hospital room?: A Little   6 Click Score: 15    End of Session Equipment Utilized During Treatment: Gait belt Activity Tolerance: Patient tolerated treatment well Patient left: in chair;with call bell/phone within reach;with family/visitor present;with nursing/sitter in room Nurse Communication: Mobility status PT Visit Diagnosis: Muscle weakness (generalized) (M62.81);Difficulty in walking, not elsewhere classified (R26.2);Pain Pain - Right/Left: Right Pain - part of body: Knee     Time: 0923-0952 PT Time Calculation (min) (ACUTE ONLY): 29 min  Charges:    $Gait Training: 8-22 mins $Therapeutic Exercise: 8-22 mins PT General Charges $$ ACUTE PT VISIT: 1 Visit                     Dorina HERO. Fairly IV, PT, DPT Physical Therapist- [facility] 01/07/2024, 11:02 AM

## 2024-01-07 NOTE — Evaluation (Signed)
 Occupational Therapy Evaluation Patient Details Name: Leonard Hughes MRN: [id number] DOB: [birth year] Today's Date: 01/07/2024   History of Present Illness   Pt is a 78 y.o. male s/p R TKA on 01/06/24.    Clinical Impressions Pt seen for OT evaluation this date, POD#1 from above surgery. Pt was independent in all ADL prior to surgery, however increasingly limited due to R knee pain. Pt is eager to return to PLOF with less pain and improved safety and independence. Pt currently requires assist for compression stocking mgt and polar care mgt. After initial cues for sequencing, pt able to complete LB dressing with mod indep. Pt/dtr instructed in polar care mgt, falls prevention strategies, home/routines modifications, DME/AE for LB bathing and dressing tasks, and compression stocking mgt. Pt/dtr verbalized understanding and a handout was provided to support recall and carryover. Do not currently anticipate any additional skilled OT needs at this time. Will sign off.    If plan is discharge home, recommend the following:   Assistance with cooking/housework;Assist for transportation;A little help with bathing/dressing/bathroom;Help with stairs or ramp for entrance     Functional Status Assessment   Patient has had a recent decline in their functional status and demonstrates the ability to make significant improvements in function in a reasonable and predictable amount of time.     Equipment Recommendations   BSC/3in1     Recommendations for Other Services         Precautions/Restrictions   Precautions Precautions: Knee Precaution Booklet Issued: Yes (comment) Recall of Precautions/Restrictions: Intact Restrictions Weight Bearing Restrictions Per Provider Order: Yes RLE Weight Bearing Per Provider Order: Weight bearing as tolerated     Mobility Bed Mobility Overal bed mobility: Modified Independent                  Transfers Overall transfer level: Needs  assistance Equipment used: Rolling walker (2 wheels) Transfers: Sit to/from Stand Sit to Stand: Supervision, Contact guard assist                  Balance Overall balance assessment: Mild deficits observed, not formally tested                                         ADL either performed or assessed with clinical judgement   ADL Overall ADL's : Needs assistance/impaired                                       General ADL Comments: Pt requires assist for compression stockings and polar care mgt. Pt able to complete LB dressing mod indep after cues to thread RLE into clothing first. Family verbalized ability to assist as needed.     Vision         Perception         Praxis         Pertinent Vitals/Pain Pain Assessment Pain Assessment: No/denies pain     Extremity/Trunk Assessment Upper Extremity Assessment Upper Extremity Assessment: Overall WFL for tasks assessed   Lower Extremity Assessment Lower Extremity Assessment: Defer to PT evaluation;RLE deficits/detail RLE Deficits / Details: expected ROM and strength deficits post op       Communication     Cognition Arousal: Alert Behavior During Therapy: WFL for tasks assessed/performed Cognition: No apparent impairments  Following commands: Intact       Cueing  General Comments   Cueing Techniques: Verbal cues      Exercises Other Exercises Other Exercises: Pt/dtr instructed in home/routines modifications, falls prevention, compression stocking mgt, polar care mgt, risks of over doing, and AE/DME. Handout provided.   Shoulder Instructions      Home Living Family/patient expects to be discharged to:: Private residence Living Arrangements: Alone Available Help at Discharge: Family;Available PRN/intermittently Type of Home: House Home Access: Stairs to enter [employer] of Steps: 2 from garage (no railing); 3-4  at front door with L railing   Home Layout: Two level;Able to live on main level with bedroom/bathroom     Bathroom Shower/Tub: Producer, television/film/video: Standard Bathroom Accessibility: Yes   Home Equipment: Agricultural consultant (2 wheels);Cane - single point;Adaptive equipment Adaptive Equipment: Reacher        Prior Functioning/Environment Prior Level of Function : Independent/Modified Independent;Working/employed;Driving                    OT Problem List: Decreased strength;Decreased range of motion;Decreased knowledge of use of DME or AE   OT Treatment/Interventions:        OT Goals(Current goals can be found in the care plan section)   Acute Rehab OT Goals Patient Stated Goal: get back to golf OT Goal Formulation: All assessment and education complete, DC therapy   OT Frequency:       Co-evaluation              AM-PAC OT 6 Clicks Daily Activity     Outcome Measure Help from another person eating meals?: None Help from another person taking care of personal grooming?: None Help from another person toileting, which includes using toliet, bedpan, or urinal?: None Help from another person bathing (including washing, rinsing, drying)?: A Little Help from another person to put on and taking off regular upper body clothing?: None Help from another person to put on and taking off regular lower body clothing?: None 6 Click Score: 23   End of Session Equipment Utilized During Treatment: Rolling walker (2 wheels)  Activity Tolerance: Patient tolerated treatment well Patient left: in chair;with call bell/phone within reach;with family/visitor present  OT Visit Diagnosis: Other abnormalities of gait and mobility (R26.89)                Time: 9159-9092 OT Time Calculation (min): 27 min Charges:  OT General Charges $OT Visit: 1 Visit OT Evaluation $OT Eval Low Complexity: 1 Low OT Treatments $Self Care/Home Management : 8-22 mins  Warren SAUNDERS., MPH,  MS, OTR/L ascom [phone] 01/07/24, 9:54 AM

## 2024-01-07 NOTE — Anesthesia Postprocedure Evaluation (Signed)
 Anesthesia Post Note  Patient: Leonard Hughes  Procedure(s) Performed: ARTHROPLASTY, KNEE, TOTAL, USING IMAGELESS COMPUTER-ASSISTED NAVIGATION (Right: Knee)  Patient location during evaluation: Nursing Unit Anesthesia Type: Spinal Level of consciousness: oriented and awake and alert Pain management: pain level controlled Vital Signs Assessment: post-procedure vital signs reviewed and stable Respiratory status: spontaneous breathing and respiratory function stable Cardiovascular status: blood pressure returned to baseline and stable Postop Assessment: no headache, no backache, no apparent nausea or vomiting and patient able to bend at knees Anesthetic complications: no   No notable events documented.   Last Vitals:  Vitals:   01/07/24 0736 01/07/24 0956  BP: (!) 147/56 (!) 130/52  Pulse: 60 69  Resp: 17 17  Temp: 36.6 C   SpO2: 99% 95%    Last Pain:  Vitals:   01/07/24 0736  TempSrc: Oral  PainSc: 0-No pain                 Klaudia Beirne Belinda

## 2024-01-07 NOTE — Plan of Care (Signed)
 Progressing towards discharge

## 2024-01-07 NOTE — Progress Notes (Signed)
 DISCHARGE NOTE:  Pt and daughter given discharge instructions, scripts and 2 honeycomb dressings, and verbalized understanding. BSC delivered to room and sent with pt, Pt wheeled to car by staff, daughter providing transportation home.

## 2024-01-07 NOTE — Plan of Care (Signed)
   Problem: Activity: Goal: Ability to avoid complications of mobility impairment will improve Outcome: Progressing   Problem: Pain Management: Goal: Pain level will decrease with appropriate interventions Outcome: Progressing   Problem: Skin Integrity: Goal: Will show signs of wound healing Outcome: Progressing

## 2024-01-07 NOTE — Progress Notes (Signed)
 Subjective: 1 Day Post-Op Procedure(s) (LRB): ARTHROPLASTY, KNEE, TOTAL, USING IMAGELESS COMPUTER-ASSISTED NAVIGATION (Right) Patient reports pain as mild.   Patient seen in rounds with Dr. Mardee. Patient is well, and has had no acute complaints or problems Denies any CP, SOB, N/V, fevers or chills We will start therapy today.  Plan is to go Home after hospital stay.  Objective: Vital signs in last 24 hours: Temp:  [97 F (36.1 C)-98.1 F (36.7 C)] 97.8 F (36.6 C) (08/14 0736) Pulse Rate:  [47-63] 60 (08/14 0736) Resp:  [11-23] 17 (08/14 0736) BP: (113-147)/(48-115) 147/56 (08/14 0736) SpO2:  [92 %-99 %] 99 % (08/14 0736)  Intake/Output from previous day:  Intake/Output Summary (Last 24 hours) at 01/07/2024 0802 Last data filed at 01/07/2024 0743 Gross per 24 hour  Intake 2146.67 ml  Output 2510 ml  Net -363.33 ml    Intake/Output this shift: Total I/O In: -  Out: 140 [Drains:140]  Labs: No results for input(s): HGB in the last 72 hours. No results for input(s): WBC, RBC, HCT, PLT in the last 72 hours. No results for input(s): NA, K, CL, CO2, BUN, CREATININE, GLUCOSE, CALCIUM  in the last 72 hours. No results for input(s): LABPT, INR in the last 72 hours.  EXAM General - Patient is Alert, Appropriate, and Oriented Extremity - Neurologically intact Neurovascular intact Sensation intact distally Intact pulses distally Dorsiflexion/Plantar flexion intact No cellulitis present Compartment soft Dressing - dressing C/D/I and no drainage Motor Function - intact, moving foot and toes well on exam. JP Drain pulled without difficulty. Intact  Past Medical History:  Diagnosis Date   Aortic stenosis    Coronary artery disease 2001   a.) 95% mLCx (3.0 x 15 mm S670 BMS); b.) 2v CABG (RIMA-LAD, LIMA-OM1) 07/30/2005   DDD (degenerative disc disease), lumbosacral    Diastolic dysfunction    GERD (gastroesophageal reflux disease)    Heart  murmur    History of chicken pox    Hyperlipidemia    Hypertension    Long-term use of aspirin  therapy    Pre-diabetes    Primary osteoarthritis of right knee    S/P CABG x 2 07/30/2005   a.) 2v CABG (RIMA-LAD, LIMA-OM1) 07/30/2005    Assessment/Plan: 1 Day Post-Op Procedure(s) (LRB): ARTHROPLASTY, KNEE, TOTAL, USING IMAGELESS COMPUTER-ASSISTED NAVIGATION (Right) Principal Problem:   History of total knee arthroplasty, right  Estimated body mass index is 32.19 kg/m as calculated from the following:   Height as of this encounter: 5' 11 (1.803 m).   Weight as of this encounter: 104.7 kg. Advance diet Up with therapy  Patient will continue to work with physical therapy to pass postoperative PT protocols, ROM and strengthening  Discussed with the patient continuing to utilize Polar Care  Patient will use bone foam in 20-30 minute intervals  Patient will wear TED hose bilaterally to help prevent DVT and clot formation  Discussed the Aquacel bandage.  This bandage will stay in place 7 days postoperatively.  Can be replaced with honeycomb bandages that will be sent home with the patient  Discussed sending the patient home with tramadol  and oxycodone  for as needed pain management.  Patient will also be sent home with Celebrex  to help with swelling and inflammation.  Patient will take an 81 mg aspirin  twice daily for DVT prophylaxis  JP drain removed without difficulty, intact  Weight-Bearing as tolerated to right leg  Patient will follow-up with [facility] in 2 weeks for staple removal and reevaluation  Fonda  Drake RIGGERS [facility] 01/07/2024, 8:02 AM

## 2024-01-08 DIAGNOSIS — I350 Nonrheumatic aortic (valve) stenosis: Secondary | ICD-10-CM | POA: Diagnosis not present

## 2024-01-08 DIAGNOSIS — E785 Hyperlipidemia, unspecified: Secondary | ICD-10-CM | POA: Diagnosis not present

## 2024-01-08 DIAGNOSIS — Z96651 Presence of right artificial knee joint: Secondary | ICD-10-CM | POA: Diagnosis not present

## 2024-01-08 DIAGNOSIS — Z471 Aftercare following joint replacement surgery: Secondary | ICD-10-CM | POA: Diagnosis not present

## 2024-01-08 DIAGNOSIS — I2510 Atherosclerotic heart disease of native coronary artery without angina pectoris: Secondary | ICD-10-CM | POA: Diagnosis not present

## 2024-01-08 DIAGNOSIS — I10 Essential (primary) hypertension: Secondary | ICD-10-CM | POA: Diagnosis not present

## 2024-01-08 DIAGNOSIS — M51379 Other intervertebral disc degeneration, lumbosacral region without mention of lumbar back pain or lower extremity pain: Secondary | ICD-10-CM | POA: Diagnosis not present

## 2024-01-08 DIAGNOSIS — I6529 Occlusion and stenosis of unspecified carotid artery: Secondary | ICD-10-CM | POA: Diagnosis not present

## 2024-01-08 DIAGNOSIS — K219 Gastro-esophageal reflux disease without esophagitis: Secondary | ICD-10-CM | POA: Diagnosis not present

## 2024-01-10 DIAGNOSIS — I350 Nonrheumatic aortic (valve) stenosis: Secondary | ICD-10-CM | POA: Diagnosis not present

## 2024-01-10 DIAGNOSIS — M51379 Other intervertebral disc degeneration, lumbosacral region without mention of lumbar back pain or lower extremity pain: Secondary | ICD-10-CM | POA: Diagnosis not present

## 2024-01-10 DIAGNOSIS — Z96651 Presence of right artificial knee joint: Secondary | ICD-10-CM | POA: Diagnosis not present

## 2024-01-10 DIAGNOSIS — K219 Gastro-esophageal reflux disease without esophagitis: Secondary | ICD-10-CM | POA: Diagnosis not present

## 2024-01-10 DIAGNOSIS — I6529 Occlusion and stenosis of unspecified carotid artery: Secondary | ICD-10-CM | POA: Diagnosis not present

## 2024-01-10 DIAGNOSIS — E785 Hyperlipidemia, unspecified: Secondary | ICD-10-CM | POA: Diagnosis not present

## 2024-01-10 DIAGNOSIS — I10 Essential (primary) hypertension: Secondary | ICD-10-CM | POA: Diagnosis not present

## 2024-01-10 DIAGNOSIS — I2510 Atherosclerotic heart disease of native coronary artery without angina pectoris: Secondary | ICD-10-CM | POA: Diagnosis not present

## 2024-01-10 DIAGNOSIS — Z471 Aftercare following joint replacement surgery: Secondary | ICD-10-CM | POA: Diagnosis not present

## 2024-01-11 DIAGNOSIS — I2510 Atherosclerotic heart disease of native coronary artery without angina pectoris: Secondary | ICD-10-CM | POA: Diagnosis not present

## 2024-01-11 DIAGNOSIS — I350 Nonrheumatic aortic (valve) stenosis: Secondary | ICD-10-CM | POA: Diagnosis not present

## 2024-01-11 DIAGNOSIS — K219 Gastro-esophageal reflux disease without esophagitis: Secondary | ICD-10-CM | POA: Diagnosis not present

## 2024-01-11 DIAGNOSIS — E785 Hyperlipidemia, unspecified: Secondary | ICD-10-CM | POA: Diagnosis not present

## 2024-01-11 DIAGNOSIS — I10 Essential (primary) hypertension: Secondary | ICD-10-CM | POA: Diagnosis not present

## 2024-01-11 DIAGNOSIS — I6529 Occlusion and stenosis of unspecified carotid artery: Secondary | ICD-10-CM | POA: Diagnosis not present

## 2024-01-11 DIAGNOSIS — Z96651 Presence of right artificial knee joint: Secondary | ICD-10-CM | POA: Diagnosis not present

## 2024-01-11 DIAGNOSIS — M51379 Other intervertebral disc degeneration, lumbosacral region without mention of lumbar back pain or lower extremity pain: Secondary | ICD-10-CM | POA: Diagnosis not present

## 2024-01-11 DIAGNOSIS — Z471 Aftercare following joint replacement surgery: Secondary | ICD-10-CM | POA: Diagnosis not present

## 2024-01-13 DIAGNOSIS — I2510 Atherosclerotic heart disease of native coronary artery without angina pectoris: Secondary | ICD-10-CM | POA: Diagnosis not present

## 2024-01-13 DIAGNOSIS — E785 Hyperlipidemia, unspecified: Secondary | ICD-10-CM | POA: Diagnosis not present

## 2024-01-13 DIAGNOSIS — M51379 Other intervertebral disc degeneration, lumbosacral region without mention of lumbar back pain or lower extremity pain: Secondary | ICD-10-CM | POA: Diagnosis not present

## 2024-01-13 DIAGNOSIS — Z96651 Presence of right artificial knee joint: Secondary | ICD-10-CM | POA: Diagnosis not present

## 2024-01-13 DIAGNOSIS — K219 Gastro-esophageal reflux disease without esophagitis: Secondary | ICD-10-CM | POA: Diagnosis not present

## 2024-01-13 DIAGNOSIS — I10 Essential (primary) hypertension: Secondary | ICD-10-CM | POA: Diagnosis not present

## 2024-01-13 DIAGNOSIS — I350 Nonrheumatic aortic (valve) stenosis: Secondary | ICD-10-CM | POA: Diagnosis not present

## 2024-01-13 DIAGNOSIS — Z471 Aftercare following joint replacement surgery: Secondary | ICD-10-CM | POA: Diagnosis not present

## 2024-01-13 DIAGNOSIS — I6529 Occlusion and stenosis of unspecified carotid artery: Secondary | ICD-10-CM | POA: Diagnosis not present

## 2024-01-14 DIAGNOSIS — I10 Essential (primary) hypertension: Secondary | ICD-10-CM | POA: Diagnosis not present

## 2024-01-14 DIAGNOSIS — E785 Hyperlipidemia, unspecified: Secondary | ICD-10-CM | POA: Diagnosis not present

## 2024-01-14 DIAGNOSIS — M51379 Other intervertebral disc degeneration, lumbosacral region without mention of lumbar back pain or lower extremity pain: Secondary | ICD-10-CM | POA: Diagnosis not present

## 2024-01-14 DIAGNOSIS — I2510 Atherosclerotic heart disease of native coronary artery without angina pectoris: Secondary | ICD-10-CM | POA: Diagnosis not present

## 2024-01-14 DIAGNOSIS — K219 Gastro-esophageal reflux disease without esophagitis: Secondary | ICD-10-CM | POA: Diagnosis not present

## 2024-01-14 DIAGNOSIS — Z471 Aftercare following joint replacement surgery: Secondary | ICD-10-CM | POA: Diagnosis not present

## 2024-01-14 DIAGNOSIS — Z96651 Presence of right artificial knee joint: Secondary | ICD-10-CM | POA: Diagnosis not present

## 2024-01-14 DIAGNOSIS — I350 Nonrheumatic aortic (valve) stenosis: Secondary | ICD-10-CM | POA: Diagnosis not present

## 2024-01-14 DIAGNOSIS — I6529 Occlusion and stenosis of unspecified carotid artery: Secondary | ICD-10-CM | POA: Diagnosis not present

## 2024-01-18 DIAGNOSIS — I350 Nonrheumatic aortic (valve) stenosis: Secondary | ICD-10-CM | POA: Diagnosis not present

## 2024-01-18 DIAGNOSIS — K219 Gastro-esophageal reflux disease without esophagitis: Secondary | ICD-10-CM | POA: Diagnosis not present

## 2024-01-18 DIAGNOSIS — I2510 Atherosclerotic heart disease of native coronary artery without angina pectoris: Secondary | ICD-10-CM | POA: Diagnosis not present

## 2024-01-18 DIAGNOSIS — Z471 Aftercare following joint replacement surgery: Secondary | ICD-10-CM | POA: Diagnosis not present

## 2024-01-18 DIAGNOSIS — Z96651 Presence of right artificial knee joint: Secondary | ICD-10-CM | POA: Diagnosis not present

## 2024-01-18 DIAGNOSIS — E785 Hyperlipidemia, unspecified: Secondary | ICD-10-CM | POA: Diagnosis not present

## 2024-01-18 DIAGNOSIS — I10 Essential (primary) hypertension: Secondary | ICD-10-CM | POA: Diagnosis not present

## 2024-01-18 DIAGNOSIS — I6529 Occlusion and stenosis of unspecified carotid artery: Secondary | ICD-10-CM | POA: Diagnosis not present

## 2024-01-18 DIAGNOSIS — M51379 Other intervertebral disc degeneration, lumbosacral region without mention of lumbar back pain or lower extremity pain: Secondary | ICD-10-CM | POA: Diagnosis not present

## 2024-01-20 DIAGNOSIS — Z96651 Presence of right artificial knee joint: Secondary | ICD-10-CM | POA: Diagnosis not present

## 2024-01-20 DIAGNOSIS — I2510 Atherosclerotic heart disease of native coronary artery without angina pectoris: Secondary | ICD-10-CM | POA: Diagnosis not present

## 2024-01-20 DIAGNOSIS — I10 Essential (primary) hypertension: Secondary | ICD-10-CM | POA: Diagnosis not present

## 2024-01-20 DIAGNOSIS — E785 Hyperlipidemia, unspecified: Secondary | ICD-10-CM | POA: Diagnosis not present

## 2024-01-20 DIAGNOSIS — M51379 Other intervertebral disc degeneration, lumbosacral region without mention of lumbar back pain or lower extremity pain: Secondary | ICD-10-CM | POA: Diagnosis not present

## 2024-01-20 DIAGNOSIS — Z471 Aftercare following joint replacement surgery: Secondary | ICD-10-CM | POA: Diagnosis not present

## 2024-01-20 DIAGNOSIS — K219 Gastro-esophageal reflux disease without esophagitis: Secondary | ICD-10-CM | POA: Diagnosis not present

## 2024-01-20 DIAGNOSIS — I6529 Occlusion and stenosis of unspecified carotid artery: Secondary | ICD-10-CM | POA: Diagnosis not present

## 2024-01-20 DIAGNOSIS — I350 Nonrheumatic aortic (valve) stenosis: Secondary | ICD-10-CM | POA: Diagnosis not present

## 2024-01-21 DIAGNOSIS — Z96651 Presence of right artificial knee joint: Secondary | ICD-10-CM | POA: Diagnosis not present

## 2024-01-21 DIAGNOSIS — M25561 Pain in right knee: Secondary | ICD-10-CM | POA: Diagnosis not present

## 2024-01-27 DIAGNOSIS — Z96651 Presence of right artificial knee joint: Secondary | ICD-10-CM | POA: Diagnosis not present

## 2024-01-27 DIAGNOSIS — E782 Mixed hyperlipidemia: Secondary | ICD-10-CM | POA: Diagnosis not present

## 2024-01-27 DIAGNOSIS — M25561 Pain in right knee: Secondary | ICD-10-CM | POA: Diagnosis not present

## 2024-01-27 DIAGNOSIS — Z125 Encounter for screening for malignant neoplasm of prostate: Secondary | ICD-10-CM | POA: Diagnosis not present

## 2024-01-27 DIAGNOSIS — R739 Hyperglycemia, unspecified: Secondary | ICD-10-CM | POA: Diagnosis not present

## 2024-01-27 DIAGNOSIS — E538 Deficiency of other specified B group vitamins: Secondary | ICD-10-CM | POA: Diagnosis not present

## 2024-01-29 DIAGNOSIS — M25561 Pain in right knee: Secondary | ICD-10-CM | POA: Diagnosis not present

## 2024-01-29 DIAGNOSIS — Z96651 Presence of right artificial knee joint: Secondary | ICD-10-CM | POA: Diagnosis not present

## 2024-02-01 DIAGNOSIS — I25810 Atherosclerosis of coronary artery bypass graft(s) without angina pectoris: Secondary | ICD-10-CM | POA: Diagnosis not present

## 2024-02-01 DIAGNOSIS — Z0000 Encounter for general adult medical examination without abnormal findings: Secondary | ICD-10-CM | POA: Diagnosis not present

## 2024-02-01 DIAGNOSIS — D509 Iron deficiency anemia, unspecified: Secondary | ICD-10-CM | POA: Diagnosis not present

## 2024-02-01 DIAGNOSIS — M25561 Pain in right knee: Secondary | ICD-10-CM | POA: Diagnosis not present

## 2024-02-01 DIAGNOSIS — Z125 Encounter for screening for malignant neoplasm of prostate: Secondary | ICD-10-CM | POA: Diagnosis not present

## 2024-02-01 DIAGNOSIS — R972 Elevated prostate specific antigen [PSA]: Secondary | ICD-10-CM | POA: Diagnosis not present

## 2024-02-01 DIAGNOSIS — G629 Polyneuropathy, unspecified: Secondary | ICD-10-CM | POA: Diagnosis not present

## 2024-02-01 DIAGNOSIS — Z1331 Encounter for screening for depression: Secondary | ICD-10-CM | POA: Diagnosis not present

## 2024-02-01 DIAGNOSIS — Z96651 Presence of right artificial knee joint: Secondary | ICD-10-CM | POA: Diagnosis not present

## 2024-02-03 DIAGNOSIS — M25561 Pain in right knee: Secondary | ICD-10-CM | POA: Diagnosis not present

## 2024-02-03 DIAGNOSIS — Z96651 Presence of right artificial knee joint: Secondary | ICD-10-CM | POA: Diagnosis not present

## 2024-02-05 DIAGNOSIS — M25561 Pain in right knee: Secondary | ICD-10-CM | POA: Diagnosis not present

## 2024-02-05 DIAGNOSIS — Z96651 Presence of right artificial knee joint: Secondary | ICD-10-CM | POA: Diagnosis not present

## 2024-02-08 DIAGNOSIS — M25561 Pain in right knee: Secondary | ICD-10-CM | POA: Diagnosis not present

## 2024-02-08 DIAGNOSIS — Z96651 Presence of right artificial knee joint: Secondary | ICD-10-CM | POA: Diagnosis not present

## 2024-02-09 DIAGNOSIS — Z471 Aftercare following joint replacement surgery: Secondary | ICD-10-CM | POA: Diagnosis not present

## 2024-02-10 DIAGNOSIS — Z96651 Presence of right artificial knee joint: Secondary | ICD-10-CM | POA: Diagnosis not present

## 2024-02-10 DIAGNOSIS — M25561 Pain in right knee: Secondary | ICD-10-CM | POA: Diagnosis not present

## 2024-02-12 DIAGNOSIS — Z96651 Presence of right artificial knee joint: Secondary | ICD-10-CM | POA: Diagnosis not present

## 2024-02-12 DIAGNOSIS — M25561 Pain in right knee: Secondary | ICD-10-CM | POA: Diagnosis not present

## 2024-02-15 DIAGNOSIS — Z96651 Presence of right artificial knee joint: Secondary | ICD-10-CM | POA: Diagnosis not present

## 2024-02-15 DIAGNOSIS — M25561 Pain in right knee: Secondary | ICD-10-CM | POA: Diagnosis not present

## 2024-02-17 DIAGNOSIS — M25561 Pain in right knee: Secondary | ICD-10-CM | POA: Diagnosis not present

## 2024-02-17 DIAGNOSIS — Z96651 Presence of right artificial knee joint: Secondary | ICD-10-CM | POA: Diagnosis not present

## 2024-02-19 DIAGNOSIS — Z96651 Presence of right artificial knee joint: Secondary | ICD-10-CM | POA: Diagnosis not present

## 2024-02-19 DIAGNOSIS — M25561 Pain in right knee: Secondary | ICD-10-CM | POA: Diagnosis not present

## 2024-02-22 DIAGNOSIS — H524 Presbyopia: Secondary | ICD-10-CM | POA: Diagnosis not present

## 2024-02-22 DIAGNOSIS — M25561 Pain in right knee: Secondary | ICD-10-CM | POA: Diagnosis not present

## 2024-02-22 DIAGNOSIS — Z96651 Presence of right artificial knee joint: Secondary | ICD-10-CM | POA: Diagnosis not present

## 2024-02-24 DIAGNOSIS — Z96651 Presence of right artificial knee joint: Secondary | ICD-10-CM | POA: Diagnosis not present

## 2024-02-24 DIAGNOSIS — M25561 Pain in right knee: Secondary | ICD-10-CM | POA: Diagnosis not present

## 2024-02-25 DIAGNOSIS — Z96651 Presence of right artificial knee joint: Secondary | ICD-10-CM | POA: Diagnosis not present

## 2024-02-29 DIAGNOSIS — M25561 Pain in right knee: Secondary | ICD-10-CM | POA: Diagnosis not present

## 2024-02-29 DIAGNOSIS — Z96651 Presence of right artificial knee joint: Secondary | ICD-10-CM | POA: Diagnosis not present

## 2024-03-03 DIAGNOSIS — M25561 Pain in right knee: Secondary | ICD-10-CM | POA: Diagnosis not present

## 2024-03-03 DIAGNOSIS — Z96651 Presence of right artificial knee joint: Secondary | ICD-10-CM | POA: Diagnosis not present

## 2024-03-07 DIAGNOSIS — Z96651 Presence of right artificial knee joint: Secondary | ICD-10-CM | POA: Diagnosis not present

## 2024-03-07 DIAGNOSIS — M25561 Pain in right knee: Secondary | ICD-10-CM | POA: Diagnosis not present

## 2024-03-10 DIAGNOSIS — M25561 Pain in right knee: Secondary | ICD-10-CM | POA: Diagnosis not present

## 2024-03-10 DIAGNOSIS — Z96651 Presence of right artificial knee joint: Secondary | ICD-10-CM | POA: Diagnosis not present

## 2024-03-15 DIAGNOSIS — Z96651 Presence of right artificial knee joint: Secondary | ICD-10-CM | POA: Diagnosis not present

## 2024-03-15 DIAGNOSIS — M25561 Pain in right knee: Secondary | ICD-10-CM | POA: Diagnosis not present

## 2024-03-18 DIAGNOSIS — Z96651 Presence of right artificial knee joint: Secondary | ICD-10-CM | POA: Diagnosis not present

## 2024-03-18 DIAGNOSIS — M25561 Pain in right knee: Secondary | ICD-10-CM | POA: Diagnosis not present

## 2024-03-21 DIAGNOSIS — Z96651 Presence of right artificial knee joint: Secondary | ICD-10-CM | POA: Diagnosis not present

## 2024-03-21 DIAGNOSIS — M25561 Pain in right knee: Secondary | ICD-10-CM | POA: Diagnosis not present

## 2024-03-22 DIAGNOSIS — L538 Other specified erythematous conditions: Secondary | ICD-10-CM | POA: Diagnosis not present

## 2024-03-22 DIAGNOSIS — L820 Inflamed seborrheic keratosis: Secondary | ICD-10-CM | POA: Diagnosis not present

## 2024-03-22 DIAGNOSIS — R208 Other disturbances of skin sensation: Secondary | ICD-10-CM | POA: Diagnosis not present

## 2024-03-22 DIAGNOSIS — L309 Dermatitis, unspecified: Secondary | ICD-10-CM | POA: Diagnosis not present

## 2024-03-24 DIAGNOSIS — M25561 Pain in right knee: Secondary | ICD-10-CM | POA: Diagnosis not present

## 2024-03-24 DIAGNOSIS — Z96651 Presence of right artificial knee joint: Secondary | ICD-10-CM | POA: Diagnosis not present

## 2024-03-29 DIAGNOSIS — Z96651 Presence of right artificial knee joint: Secondary | ICD-10-CM | POA: Diagnosis not present

## 2024-03-29 DIAGNOSIS — M25561 Pain in right knee: Secondary | ICD-10-CM | POA: Diagnosis not present

## 2024-04-01 DIAGNOSIS — Z96651 Presence of right artificial knee joint: Secondary | ICD-10-CM | POA: Diagnosis not present

## 2024-04-01 DIAGNOSIS — M25561 Pain in right knee: Secondary | ICD-10-CM | POA: Diagnosis not present

## 2024-04-04 DIAGNOSIS — Z96651 Presence of right artificial knee joint: Secondary | ICD-10-CM | POA: Diagnosis not present

## 2024-04-04 DIAGNOSIS — M25561 Pain in right knee: Secondary | ICD-10-CM | POA: Diagnosis not present

## 2024-04-08 DIAGNOSIS — M25561 Pain in right knee: Secondary | ICD-10-CM | POA: Diagnosis not present

## 2024-04-08 DIAGNOSIS — Z96651 Presence of right artificial knee joint: Secondary | ICD-10-CM | POA: Diagnosis not present

## 2024-04-11 DIAGNOSIS — R2689 Other abnormalities of gait and mobility: Secondary | ICD-10-CM | POA: Diagnosis not present

## 2024-04-11 DIAGNOSIS — G629 Polyneuropathy, unspecified: Secondary | ICD-10-CM | POA: Diagnosis not present

## 2024-04-18 DIAGNOSIS — I25810 Atherosclerosis of coronary artery bypass graft(s) without angina pectoris: Secondary | ICD-10-CM | POA: Diagnosis not present

## 2024-04-18 DIAGNOSIS — G629 Polyneuropathy, unspecified: Secondary | ICD-10-CM | POA: Diagnosis not present

## 2024-04-18 DIAGNOSIS — E782 Mixed hyperlipidemia: Secondary | ICD-10-CM | POA: Diagnosis not present

## 2024-04-18 DIAGNOSIS — Z955 Presence of coronary angioplasty implant and graft: Secondary | ICD-10-CM | POA: Diagnosis not present

## 2024-04-18 DIAGNOSIS — I10 Essential (primary) hypertension: Secondary | ICD-10-CM | POA: Diagnosis not present

## 2024-04-18 DIAGNOSIS — I350 Nonrheumatic aortic (valve) stenosis: Secondary | ICD-10-CM | POA: Diagnosis not present

## 2024-06-02 ENCOUNTER — Other Ambulatory Visit: Payer: Self-pay | Admitting: Internal Medicine

## 2024-06-02 DIAGNOSIS — R972 Elevated prostate specific antigen [PSA]: Secondary | ICD-10-CM

## 2024-06-09 ENCOUNTER — Ambulatory Visit
Admission: RE | Admit: 2024-06-09 | Discharge: 2024-06-09 | Disposition: A | Discharge status: Home / Self Care | Source: Ambulatory Visit | Attending: Internal Medicine | Admitting: Internal Medicine

## 2024-06-09 DIAGNOSIS — R972 Elevated prostate specific antigen [PSA]: Secondary | ICD-10-CM | POA: Insufficient documentation

## 2024-06-09 MED ORDER — GADOBUTROL 1 MMOL/ML IV SOLN
10.0000 mL | Freq: Once | INTRAVENOUS | Status: AC | PRN
  Administered 2024-06-09: 10 mL via INTRAVENOUS
